# Patient Record
Sex: Female | Born: 1950 | Race: White | Hispanic: No
Health system: Southern US, Community
[De-identification: ages and names within clinical notes are randomized; demographics above are authoritative.]

## PROBLEM LIST (undated history)

## (undated) DIAGNOSIS — J9621 Acute and chronic respiratory failure with hypoxia: Secondary | ICD-10-CM

## (undated) DIAGNOSIS — J80 Acute respiratory distress syndrome: Secondary | ICD-10-CM

## (undated) DIAGNOSIS — G1221 Amyotrophic lateral sclerosis: Secondary | ICD-10-CM

## (undated) DIAGNOSIS — J690 Pneumonitis due to inhalation of food and vomit: Secondary | ICD-10-CM

---

## 2002-02-27 ENCOUNTER — Encounter: Payer: Self-pay | Admitting: Specialist

## 2002-03-05 ENCOUNTER — Inpatient Hospital Stay (HOSPITAL_COMMUNITY): Admission: RE | Admit: 2002-03-05 | Discharge: 2002-03-09 | Payer: Self-pay | Admitting: Specialist

## 2002-03-05 ENCOUNTER — Encounter: Payer: Self-pay | Admitting: Specialist

## 2002-07-10 ENCOUNTER — Ambulatory Visit (HOSPITAL_COMMUNITY): Admission: RE | Admit: 2002-07-10 | Discharge: 2002-07-10 | Payer: Self-pay | Admitting: *Deleted

## 2006-12-13 ENCOUNTER — Ambulatory Visit: Payer: Self-pay | Admitting: Family Medicine

## 2006-12-13 DIAGNOSIS — N951 Menopausal and female climacteric states: Secondary | ICD-10-CM | POA: Insufficient documentation

## 2006-12-13 DIAGNOSIS — F329 Major depressive disorder, single episode, unspecified: Secondary | ICD-10-CM

## 2006-12-13 DIAGNOSIS — I10 Essential (primary) hypertension: Secondary | ICD-10-CM | POA: Insufficient documentation

## 2006-12-13 DIAGNOSIS — R609 Edema, unspecified: Secondary | ICD-10-CM

## 2006-12-13 DIAGNOSIS — K219 Gastro-esophageal reflux disease without esophagitis: Secondary | ICD-10-CM | POA: Insufficient documentation

## 2006-12-19 ENCOUNTER — Ambulatory Visit: Payer: Self-pay | Admitting: Family Medicine

## 2006-12-20 ENCOUNTER — Telehealth (INDEPENDENT_AMBULATORY_CARE_PROVIDER_SITE_OTHER): Payer: Self-pay | Admitting: *Deleted

## 2006-12-20 ENCOUNTER — Encounter: Payer: Self-pay | Admitting: Family Medicine

## 2006-12-20 LAB — CONVERTED CEMR LAB
ALT: 16 units/L (ref 0–35)
AST: 10 units/L (ref 0–37)
Albumin: 4.2 g/dL (ref 3.5–5.2)
Alkaline Phosphatase: 96 units/L (ref 39–117)
BUN: 16 mg/dL (ref 6–23)
CO2: 24 meq/L (ref 19–32)
Calcium: 9.6 mg/dL (ref 8.4–10.5)
Chloride: 100 meq/L (ref 96–112)
Cholesterol: 198 mg/dL (ref 0–200)
Creatinine, Ser: 0.67 mg/dL (ref 0.40–1.20)
Glucose, Bld: 159 mg/dL — ABNORMAL HIGH (ref 70–99)
HDL: 47 mg/dL (ref >39)
LDL Cholesterol: 113 mg/dL — ABNORMAL HIGH (ref 0–99)
Potassium: 4.3 meq/L (ref 3.5–5.3)
Sodium: 139 meq/L (ref 135–145)
TSH: 2.191 microintl units/mL (ref 0.350–5.50)
Total Bilirubin: 0.4 mg/dL (ref 0.3–1.2)
Total CHOL/HDL Ratio: 4.2
Total Protein: 7.1 g/dL (ref 6.0–8.3)
Triglycerides: 192 mg/dL — ABNORMAL HIGH (ref <150)
VLDL: 38 mg/dL (ref 0–40)

## 2007-01-04 ENCOUNTER — Encounter: Admission: RE | Admit: 2007-01-04 | Discharge: 2007-01-04 | Payer: Self-pay | Admitting: Family Medicine

## 2007-01-08 ENCOUNTER — Encounter: Payer: Self-pay | Admitting: Family Medicine

## 2007-01-09 ENCOUNTER — Telehealth (INDEPENDENT_AMBULATORY_CARE_PROVIDER_SITE_OTHER): Payer: Self-pay | Admitting: *Deleted

## 2007-02-01 ENCOUNTER — Ambulatory Visit: Payer: Self-pay | Admitting: Family Medicine

## 2007-02-28 ENCOUNTER — Ambulatory Visit: Payer: Self-pay | Admitting: Family Medicine

## 2007-04-11 ENCOUNTER — Ambulatory Visit: Payer: Self-pay | Admitting: Family Medicine

## 2007-04-11 DIAGNOSIS — R5383 Other fatigue: Secondary | ICD-10-CM

## 2007-04-11 DIAGNOSIS — R5381 Other malaise: Secondary | ICD-10-CM

## 2007-05-03 ENCOUNTER — Telehealth: Payer: Self-pay | Admitting: Family Medicine

## 2007-05-03 ENCOUNTER — Encounter: Payer: Self-pay | Admitting: Family Medicine

## 2007-06-06 ENCOUNTER — Encounter: Payer: Self-pay | Admitting: Family Medicine

## 2007-08-02 ENCOUNTER — Telehealth: Payer: Self-pay | Admitting: Family Medicine

## 2007-10-29 ENCOUNTER — Encounter: Payer: Self-pay | Admitting: Family Medicine

## 2010-12-26 ENCOUNTER — Encounter: Payer: Self-pay | Admitting: Family Medicine

## 2014-12-10 DIAGNOSIS — M545 Low back pain: Secondary | ICD-10-CM | POA: Diagnosis not present

## 2014-12-10 DIAGNOSIS — I10 Essential (primary) hypertension: Secondary | ICD-10-CM | POA: Diagnosis not present

## 2014-12-10 DIAGNOSIS — E785 Hyperlipidemia, unspecified: Secondary | ICD-10-CM | POA: Diagnosis not present

## 2014-12-10 DIAGNOSIS — F329 Major depressive disorder, single episode, unspecified: Secondary | ICD-10-CM | POA: Diagnosis not present

## 2014-12-10 DIAGNOSIS — K219 Gastro-esophageal reflux disease without esophagitis: Secondary | ICD-10-CM | POA: Diagnosis not present

## 2014-12-10 DIAGNOSIS — E119 Type 2 diabetes mellitus without complications: Secondary | ICD-10-CM | POA: Diagnosis not present

## 2015-06-23 DIAGNOSIS — Z0000 Encounter for general adult medical examination without abnormal findings: Secondary | ICD-10-CM | POA: Diagnosis not present

## 2015-12-24 DIAGNOSIS — K210 Gastro-esophageal reflux disease with esophagitis: Secondary | ICD-10-CM | POA: Diagnosis not present

## 2015-12-24 DIAGNOSIS — Z9884 Bariatric surgery status: Secondary | ICD-10-CM | POA: Diagnosis not present

## 2015-12-24 DIAGNOSIS — I10 Essential (primary) hypertension: Secondary | ICD-10-CM | POA: Diagnosis not present

## 2015-12-24 DIAGNOSIS — R739 Hyperglycemia, unspecified: Secondary | ICD-10-CM | POA: Diagnosis not present

## 2016-02-01 DIAGNOSIS — H52223 Regular astigmatism, bilateral: Secondary | ICD-10-CM | POA: Diagnosis not present

## 2016-02-01 DIAGNOSIS — H5203 Hypermetropia, bilateral: Secondary | ICD-10-CM | POA: Diagnosis not present

## 2016-02-01 DIAGNOSIS — H524 Presbyopia: Secondary | ICD-10-CM | POA: Diagnosis not present

## 2016-02-26 DIAGNOSIS — Z1231 Encounter for screening mammogram for malignant neoplasm of breast: Secondary | ICD-10-CM | POA: Diagnosis not present

## 2017-01-10 DIAGNOSIS — R1310 Dysphagia, unspecified: Secondary | ICD-10-CM | POA: Diagnosis not present

## 2017-01-10 DIAGNOSIS — E119 Type 2 diabetes mellitus without complications: Secondary | ICD-10-CM | POA: Diagnosis not present

## 2017-01-10 DIAGNOSIS — J3801 Paralysis of vocal cords and larynx, unilateral: Secondary | ICD-10-CM | POA: Diagnosis not present

## 2017-01-10 DIAGNOSIS — Z79899 Other long term (current) drug therapy: Secondary | ICD-10-CM | POA: Diagnosis not present

## 2017-01-10 DIAGNOSIS — J384 Edema of larynx: Secondary | ICD-10-CM | POA: Diagnosis not present

## 2017-01-10 DIAGNOSIS — Z7984 Long term (current) use of oral hypoglycemic drugs: Secondary | ICD-10-CM | POA: Diagnosis not present

## 2017-01-10 DIAGNOSIS — J383 Other diseases of vocal cords: Secondary | ICD-10-CM | POA: Diagnosis not present

## 2017-01-10 DIAGNOSIS — Z885 Allergy status to narcotic agent status: Secondary | ICD-10-CM | POA: Diagnosis not present

## 2017-01-10 DIAGNOSIS — J387 Other diseases of larynx: Secondary | ICD-10-CM | POA: Diagnosis not present

## 2017-01-10 DIAGNOSIS — R05 Cough: Secondary | ICD-10-CM | POA: Diagnosis not present

## 2017-01-10 DIAGNOSIS — I10 Essential (primary) hypertension: Secondary | ICD-10-CM | POA: Diagnosis not present

## 2017-03-21 DIAGNOSIS — H5203 Hypermetropia, bilateral: Secondary | ICD-10-CM | POA: Diagnosis not present

## 2017-03-21 DIAGNOSIS — H524 Presbyopia: Secondary | ICD-10-CM | POA: Diagnosis not present

## 2017-03-21 DIAGNOSIS — H31311 Expulsive choroidal hemorrhage, right eye: Secondary | ICD-10-CM | POA: Diagnosis not present

## 2017-03-21 DIAGNOSIS — H2513 Age-related nuclear cataract, bilateral: Secondary | ICD-10-CM | POA: Diagnosis not present

## 2017-03-21 DIAGNOSIS — H52223 Regular astigmatism, bilateral: Secondary | ICD-10-CM | POA: Diagnosis not present

## 2017-03-22 DIAGNOSIS — R471 Dysarthria and anarthria: Secondary | ICD-10-CM | POA: Diagnosis not present

## 2017-03-22 DIAGNOSIS — R0602 Shortness of breath: Secondary | ICD-10-CM | POA: Diagnosis not present

## 2017-03-22 DIAGNOSIS — R1310 Dysphagia, unspecified: Secondary | ICD-10-CM | POA: Diagnosis not present

## 2017-03-22 DIAGNOSIS — J383 Other diseases of vocal cords: Secondary | ICD-10-CM | POA: Diagnosis not present

## 2017-03-22 DIAGNOSIS — R490 Dysphonia: Secondary | ICD-10-CM | POA: Diagnosis not present

## 2017-03-22 DIAGNOSIS — Z823 Family history of stroke: Secondary | ICD-10-CM | POA: Diagnosis not present

## 2017-03-22 DIAGNOSIS — J384 Edema of larynx: Secondary | ICD-10-CM | POA: Diagnosis not present

## 2017-04-03 DIAGNOSIS — I10 Essential (primary) hypertension: Secondary | ICD-10-CM | POA: Diagnosis not present

## 2017-04-03 DIAGNOSIS — M545 Low back pain: Secondary | ICD-10-CM | POA: Diagnosis not present

## 2017-04-03 DIAGNOSIS — R471 Dysarthria and anarthria: Secondary | ICD-10-CM | POA: Diagnosis not present

## 2017-04-03 DIAGNOSIS — K210 Gastro-esophageal reflux disease with esophagitis: Secondary | ICD-10-CM | POA: Diagnosis not present

## 2017-04-05 DIAGNOSIS — R1310 Dysphagia, unspecified: Secondary | ICD-10-CM | POA: Diagnosis not present

## 2017-04-05 DIAGNOSIS — R1312 Dysphagia, oropharyngeal phase: Secondary | ICD-10-CM | POA: Diagnosis not present

## 2017-04-05 DIAGNOSIS — R633 Feeding difficulties: Secondary | ICD-10-CM | POA: Diagnosis not present

## 2017-04-25 DIAGNOSIS — R471 Dysarthria and anarthria: Secondary | ICD-10-CM | POA: Diagnosis not present

## 2017-04-25 DIAGNOSIS — I10 Essential (primary) hypertension: Secondary | ICD-10-CM | POA: Diagnosis not present

## 2017-04-27 DIAGNOSIS — Z885 Allergy status to narcotic agent status: Secondary | ICD-10-CM | POA: Diagnosis not present

## 2017-04-27 DIAGNOSIS — E119 Type 2 diabetes mellitus without complications: Secondary | ICD-10-CM | POA: Diagnosis not present

## 2017-04-27 DIAGNOSIS — Z9884 Bariatric surgery status: Secondary | ICD-10-CM | POA: Diagnosis not present

## 2017-04-27 DIAGNOSIS — Z79899 Other long term (current) drug therapy: Secondary | ICD-10-CM | POA: Diagnosis not present

## 2017-04-27 DIAGNOSIS — I10 Essential (primary) hypertension: Secondary | ICD-10-CM | POA: Diagnosis not present

## 2017-04-27 DIAGNOSIS — Z7984 Long term (current) use of oral hypoglycemic drugs: Secondary | ICD-10-CM | POA: Diagnosis not present

## 2017-04-27 DIAGNOSIS — R4781 Slurred speech: Secondary | ICD-10-CM | POA: Diagnosis not present

## 2017-05-11 DIAGNOSIS — G9389 Other specified disorders of brain: Secondary | ICD-10-CM | POA: Diagnosis not present

## 2017-05-11 DIAGNOSIS — R4781 Slurred speech: Secondary | ICD-10-CM | POA: Diagnosis not present

## 2017-05-11 DIAGNOSIS — I611 Nontraumatic intracerebral hemorrhage in hemisphere, cortical: Secondary | ICD-10-CM | POA: Diagnosis not present

## 2017-05-18 DIAGNOSIS — K219 Gastro-esophageal reflux disease without esophagitis: Secondary | ICD-10-CM | POA: Diagnosis not present

## 2017-05-18 DIAGNOSIS — M5441 Lumbago with sciatica, right side: Secondary | ICD-10-CM | POA: Diagnosis not present

## 2017-05-18 DIAGNOSIS — R4781 Slurred speech: Secondary | ICD-10-CM | POA: Diagnosis not present

## 2017-05-18 DIAGNOSIS — Z79899 Other long term (current) drug therapy: Secondary | ICD-10-CM | POA: Diagnosis not present

## 2017-05-18 DIAGNOSIS — E119 Type 2 diabetes mellitus without complications: Secondary | ICD-10-CM | POA: Diagnosis not present

## 2017-05-18 DIAGNOSIS — Z8639 Personal history of other endocrine, nutritional and metabolic disease: Secondary | ICD-10-CM | POA: Diagnosis not present

## 2017-05-18 DIAGNOSIS — I10 Essential (primary) hypertension: Secondary | ICD-10-CM | POA: Diagnosis not present

## 2017-05-18 DIAGNOSIS — Z96652 Presence of left artificial knee joint: Secondary | ICD-10-CM | POA: Diagnosis not present

## 2017-05-18 DIAGNOSIS — M25569 Pain in unspecified knee: Secondary | ICD-10-CM | POA: Diagnosis not present

## 2017-05-18 DIAGNOSIS — R600 Localized edema: Secondary | ICD-10-CM | POA: Diagnosis not present

## 2017-05-18 DIAGNOSIS — Z9884 Bariatric surgery status: Secondary | ICD-10-CM | POA: Diagnosis not present

## 2017-05-18 DIAGNOSIS — G8929 Other chronic pain: Secondary | ICD-10-CM | POA: Diagnosis not present

## 2017-06-10 DIAGNOSIS — Z23 Encounter for immunization: Secondary | ICD-10-CM | POA: Diagnosis not present

## 2017-06-19 DIAGNOSIS — G939 Disorder of brain, unspecified: Secondary | ICD-10-CM | POA: Diagnosis not present

## 2017-06-19 DIAGNOSIS — R490 Dysphonia: Secondary | ICD-10-CM | POA: Diagnosis not present

## 2017-08-09 DIAGNOSIS — Z23 Encounter for immunization: Secondary | ICD-10-CM | POA: Diagnosis not present

## 2017-08-21 DIAGNOSIS — M1711 Unilateral primary osteoarthritis, right knee: Secondary | ICD-10-CM | POA: Diagnosis not present

## 2017-08-21 DIAGNOSIS — S8002XA Contusion of left knee, initial encounter: Secondary | ICD-10-CM | POA: Diagnosis not present

## 2017-08-21 DIAGNOSIS — W19XXXA Unspecified fall, initial encounter: Secondary | ICD-10-CM | POA: Diagnosis not present

## 2017-08-21 DIAGNOSIS — Z96652 Presence of left artificial knee joint: Secondary | ICD-10-CM | POA: Diagnosis not present

## 2017-08-29 DIAGNOSIS — K148 Other diseases of tongue: Secondary | ICD-10-CM | POA: Diagnosis not present

## 2017-08-29 DIAGNOSIS — K219 Gastro-esophageal reflux disease without esophagitis: Secondary | ICD-10-CM | POA: Diagnosis not present

## 2017-08-29 DIAGNOSIS — R471 Dysarthria and anarthria: Secondary | ICD-10-CM | POA: Diagnosis not present

## 2019-08-23 ENCOUNTER — Inpatient Hospital Stay
Admit: 2019-08-23 | Discharge: 2019-09-23 | Disposition: A | Discharge status: Skilled Nursing Facility | Payer: Medicare Other | Source: Ambulatory Visit | Attending: Internal Medicine | Admitting: Internal Medicine

## 2019-08-23 DIAGNOSIS — J690 Pneumonitis due to inhalation of food and vomit: Secondary | ICD-10-CM | POA: Diagnosis present

## 2019-08-23 DIAGNOSIS — J9621 Acute and chronic respiratory failure with hypoxia: Secondary | ICD-10-CM | POA: Diagnosis present

## 2019-08-23 DIAGNOSIS — G1221 Amyotrophic lateral sclerosis: Secondary | ICD-10-CM | POA: Diagnosis present

## 2019-08-23 DIAGNOSIS — J80 Acute respiratory distress syndrome: Secondary | ICD-10-CM | POA: Diagnosis present

## 2019-08-23 DIAGNOSIS — R0902 Hypoxemia: Secondary | ICD-10-CM

## 2019-08-23 DIAGNOSIS — J9690 Respiratory failure, unspecified, unspecified whether with hypoxia or hypercapnia: Secondary | ICD-10-CM

## 2019-08-23 HISTORY — DX: Pneumonitis due to inhalation of food and vomit: J69.0

## 2019-08-23 HISTORY — DX: Acute respiratory distress syndrome: J80

## 2019-08-23 HISTORY — DX: Acute and chronic respiratory failure with hypoxia: J96.21

## 2019-08-23 HISTORY — DX: Amyotrophic lateral sclerosis: G12.21

## 2019-08-23 LAB — BLOOD GAS, ARTERIAL
Acid-Base Excess: 3.9 mmol/L — ABNORMAL HIGH (ref 0.0–2.0)
Bicarbonate: 27.5 mmol/L (ref 20.0–28.0)
FIO2: 40
O2 Saturation: 96.2 %
PEEP: 5 cmH2O
Patient temperature: 98.6
Pressure control: 15 cmH2O
RATE: 10 resp/min
pCO2 arterial: 38.4 mmHg (ref 32.0–48.0)
pH, Arterial: 7.469 — ABNORMAL HIGH (ref 7.350–7.450)
pO2, Arterial: 88.3 mmHg (ref 83.0–108.0)

## 2019-08-24 LAB — COMPREHENSIVE METABOLIC PANEL
ALT: 31 U/L (ref 0–44)
AST: 21 U/L (ref 15–41)
Albumin: 1.8 g/dL — ABNORMAL LOW (ref 3.5–5.0)
Alkaline Phosphatase: 58 U/L (ref 38–126)
Anion gap: 8 (ref 5–15)
BUN: 17 mg/dL (ref 8–23)
CO2: 27 mmol/L (ref 22–32)
Calcium: 8.9 mg/dL (ref 8.9–10.3)
Chloride: 105 mmol/L (ref 98–111)
Creatinine, Ser: <0.3 mg/dL — ABNORMAL LOW (ref 0.44–1.00)
Glucose, Bld: 127 mg/dL — ABNORMAL HIGH (ref 70–99)
Potassium: 3.2 mmol/L — ABNORMAL LOW (ref 3.5–5.1)
Sodium: 140 mmol/L (ref 135–145)
Total Bilirubin: 0.6 mg/dL (ref 0.3–1.2)
Total Protein: 4.7 g/dL — ABNORMAL LOW (ref 6.5–8.1)

## 2019-08-24 LAB — CBC
HCT: 26.1 % — ABNORMAL LOW (ref 36.0–46.0)
Hemoglobin: 8.6 g/dL — ABNORMAL LOW (ref 12.0–15.0)
MCH: 30.4 pg (ref 26.0–34.0)
MCHC: 33 g/dL (ref 30.0–36.0)
MCV: 92.2 fL (ref 80.0–100.0)
Platelets: 430 10*3/uL — ABNORMAL HIGH (ref 150–400)
RBC: 2.83 MIL/uL — ABNORMAL LOW (ref 3.87–5.11)
RDW: 12.7 % (ref 11.5–15.5)
WBC: 5.6 10*3/uL (ref 4.0–10.5)
nRBC: 0 % (ref 0.0–0.2)

## 2019-08-25 ENCOUNTER — Other Ambulatory Visit (HOSPITAL_COMMUNITY): Payer: Medicare Other

## 2019-08-25 LAB — BLOOD GAS, ARTERIAL
Acid-Base Excess: 7.7 mmol/L — ABNORMAL HIGH (ref 0.0–2.0)
Bicarbonate: 33.1 mmol/L — ABNORMAL HIGH (ref 20.0–28.0)
FIO2: 60
MECHVT: 300 mL
O2 Saturation: 94.3 %
PEEP: 5 cmH2O
Patient temperature: 98
RATE: 18 resp/min
pCO2 arterial: 59.7 mmHg — ABNORMAL HIGH (ref 32.0–48.0)
pH, Arterial: 7.361 (ref 7.350–7.450)
pO2, Arterial: 80.8 mmHg — ABNORMAL LOW (ref 83.0–108.0)

## 2019-08-25 LAB — POTASSIUM: Potassium: 3.8 mmol/L (ref 3.5–5.1)

## 2019-08-26 ENCOUNTER — Encounter: Payer: Self-pay | Admitting: Internal Medicine

## 2019-08-26 DIAGNOSIS — J9621 Acute and chronic respiratory failure with hypoxia: Secondary | ICD-10-CM | POA: Diagnosis present

## 2019-08-26 DIAGNOSIS — J690 Pneumonitis due to inhalation of food and vomit: Secondary | ICD-10-CM | POA: Diagnosis not present

## 2019-08-26 DIAGNOSIS — G1221 Amyotrophic lateral sclerosis: Secondary | ICD-10-CM | POA: Diagnosis not present

## 2019-08-26 DIAGNOSIS — J80 Acute respiratory distress syndrome: Secondary | ICD-10-CM | POA: Diagnosis not present

## 2019-08-26 MED ORDER — LORAZEPAM 2 MG/ML IJ SOLN
0.50 | INTRAMUSCULAR | Status: DC
Start: ? — End: 2019-08-26

## 2019-08-26 MED ORDER — BUSPIRONE HCL 5 MG PO TABS
5.00 | ORAL_TABLET | ORAL | Status: DC
Start: 2019-08-23 — End: 2019-08-26

## 2019-08-26 MED ORDER — METOPROLOL TARTRATE 5 MG/5ML IV SOLN
5.00 | INTRAVENOUS | Status: DC
Start: ? — End: 2019-08-26

## 2019-08-26 MED ORDER — ENOXAPARIN SODIUM 40 MG/0.4ML ~~LOC~~ SOLN
40.00 | SUBCUTANEOUS | Status: DC
Start: 2019-08-24 — End: 2019-08-26

## 2019-08-26 MED ORDER — SODIUM CHLORIDE 0.9 % IV SOLN
10.00 | INTRAVENOUS | Status: DC
Start: ? — End: 2019-08-26

## 2019-08-26 MED ORDER — GENERIC EXTERNAL MEDICATION
Status: DC
Start: ? — End: 2019-08-26

## 2019-08-26 MED ORDER — SODIUM CHLORIDE (PF) 0.9 % IJ SOLN
10.00 | INTRAMUSCULAR | Status: DC
Start: ? — End: 2019-08-26

## 2019-08-26 MED ORDER — HYDROCODONE-ACETAMINOPHEN 10-325 MG PO TABS
1.00 | ORAL_TABLET | ORAL | Status: DC
Start: ? — End: 2019-08-26

## 2019-08-26 MED ORDER — IPRATROPIUM-ALBUTEROL 0.5-2.5 (3) MG/3ML IN SOLN
3.00 | RESPIRATORY_TRACT | Status: DC
Start: ? — End: 2019-08-26

## 2019-08-26 MED ORDER — ACETAMINOPHEN 325 MG PO TABS
650.00 | ORAL_TABLET | ORAL | Status: DC
Start: ? — End: 2019-08-26

## 2019-08-26 MED ORDER — NOREPINEPHRINE-SODIUM CHLORIDE 8-0.9 MG/250ML-% IV SOLN
0.10 | INTRAVENOUS | Status: DC
Start: ? — End: 2019-08-26

## 2019-08-26 MED ORDER — ONDANSETRON HCL 4 MG/2ML IJ SOLN
4.00 | INTRAMUSCULAR | Status: DC
Start: ? — End: 2019-08-26

## 2019-08-26 MED ORDER — HYDROCORTISONE NA SUCCINATE PF 100 MG IJ SOLR
50.00 | INTRAMUSCULAR | Status: DC
Start: 2019-08-23 — End: 2019-08-26

## 2019-08-26 MED ORDER — GUAIFENESIN 100 MG/5ML PO SYRP
10.00 | ORAL_SOLUTION | ORAL | Status: DC
Start: ? — End: 2019-08-26

## 2019-08-26 MED ORDER — GENERIC EXTERNAL MEDICATION
150.00 | Status: DC
Start: 2019-08-24 — End: 2019-08-26

## 2019-08-26 MED ORDER — FENTANYL CITRATE-NACL 2.5-0.9 MG/250ML-% IV SOLN
1.00 | INTRAVENOUS | Status: DC
Start: ? — End: 2019-08-26

## 2019-08-26 MED ORDER — IPRATROPIUM-ALBUTEROL 0.5-2.5 (3) MG/3ML IN SOLN
3.00 | RESPIRATORY_TRACT | Status: DC
Start: 2019-08-23 — End: 2019-08-26

## 2019-08-26 MED ORDER — GENERIC EXTERNAL MEDICATION
500.00 | Status: DC
Start: 2019-08-24 — End: 2019-08-26

## 2019-08-26 MED ORDER — SCOPOLAMINE 1 MG/3DAYS TD PT72
1.00 | MEDICATED_PATCH | TRANSDERMAL | Status: DC
Start: 2019-08-25 — End: 2019-08-26

## 2019-08-26 MED ORDER — RILUZOLE 50 MG/10ML PO SUSP
50.00 | ORAL | Status: DC
Start: 2019-08-23 — End: 2019-08-26

## 2019-08-26 MED ORDER — ALBUTEROL SULFATE (2.5 MG/3ML) 0.083% IN NEBU
2.50 | INHALATION_SOLUTION | RESPIRATORY_TRACT | Status: DC
Start: ? — End: 2019-08-26

## 2019-08-26 MED ORDER — DEXMEDETOMIDINE HCL IN NACL 400 MCG/100ML IV SOLN
0.10 | INTRAVENOUS | Status: DC
Start: ? — End: 2019-08-26

## 2019-08-26 NOTE — Consult Note (Signed)
[facility] [name]  PULMONARY SERVICE  Date of Service: 08/26/2019  PULMONARY CRITICAL CARE CONSULT   Emily Moody  VZD:[id number]  DOB: 06-15-1951   DOA: 08/23/2019  Referring Physician: [name], [name] is a 68 y.o. female seen for follow up of Acute on Chronic Respiratory Failure.  Patient has multiple medical problems including ALS depression GERD hypertension diabetes mellitus who had been already significantly compromised at home using BiPAP and was not tolerating the BiPAP.  The patient presented to the hospital with pneumonia and patient was not able to come off the BiPAP even for short time.  Patient was intubated on September 7 self extubated and failed.  Patient was reintubated at that point.  Subsequently was made attempts at weaning failed multiple times and patient basically expressed that she would like to go ahead and get a tracheostomy with the expectation that patient would remain vent dependent.  On review of her history she has basically had a progressive decline over time she has had basically limited mobility with using a walker.  And it also appears that the ALS has gotten worse significantly.  Review of Systems:  ROS performed and is unremarkable other than noted above.  Past Medical History:  Diagnosis Date  . ALS (amyotrophic lateral sclerosis) (*)  . Depression  . Diabetes mellitus (*)  . GERD (gastroesophageal reflux disease)  . Hypertension   Past Surgical History:  Procedure Laterality Date  . Cesarean section  . Hysterectomy  . Joint replacement  . Peg tube placement  . Sleeve gastroplasty   Allergies  Allergen Reactions  . Morphine Hallucinations    Medications: Reviewed on Rounds  Physical Exam:  Vitals: Temperature 97.2 pulse 109 respiratory rate 21 blood pressure 146/83 saturations 97%  Ventilator Settings mode ventilation assist control FiO2 is 40% tidal volume 400 PEEP  5  . General: Comfortable at this time . Eyes: Grossly normal lids, irises & conjunctiva . ENT: grossly tongue is normal . Neck: no obvious mass . Cardiovascular: S1-S2 normal no gallop or rub is noted . Respiratory: Coarse breath sounds noted bilaterally . Abdomen: Soft and nontender . Skin: no rash seen on limited exam . Musculoskeletal: not rigid . Psychiatric:unable to assess . Neurologic: no seizure no involuntary movements         Labs on Admission:  Basic Metabolic Panel: Recent Labs  Lab 08/24/19 0730 08/25/19 0712  NA 140  --   K 3.2* 3.8  CL 105  --   CO2 27  --   GLUCOSE 127*  --   BUN 17  --   CREATININE <0.30*  --   CALCIUM 8.9  --     Recent Labs  Lab 08/23/19 2305 08/25/19 1226  PHART 7.469* 7.361  PCO2ART 38.4 59.7*  PO2ART 88.3 80.8*  HCO3 27.5 33.1*  O2SAT 96.2 94.3    Liver Function Tests: Recent Labs  Lab 08/24/19 0730  AST 21  ALT 31  ALKPHOS 58  BILITOT 0.6  PROT 4.7*  ALBUMIN 1.8*   No results for input(s): LIPASE, AMYLASE in the last 168 hours. No results for input(s): AMMONIA in the last 168 hours.  CBC: Recent Labs  Lab 08/24/19 0730  WBC 5.6  HGB 8.6*  HCT 26.1*  MCV 92.2  PLT 430*    Cardiac Enzymes: No results for input(s): CKTOTAL, CKMB, CKMBINDEX, TROPONINI in the last 168 hours.  BNP (last 3 results) No results for input(s):  BNP in the last 8760 hours.  ProBNP (last 3 results) No results for input(s): PROBNP in the last 8760 hours.   Radiological Exams on Admission: Dg Chest Port 1 View  Result Date: 08/25/2019 CLINICAL DATA:  Hypoxia EXAM: PORTABLE CHEST 1 VIEW COMPARISON:  None. FINDINGS: There is nodular appearing airspace opacity of the left mid and lower lung, with probable small bilateral pleural effusions. Tracheostomy. The heart and mediastinum are unremarkable. Right upper extremity PICC. IMPRESSION: There is nodular appearing airspace opacity of the left mid and lower lung, with probable small  bilateral pleural effusions. Findings are concerning for infection. Tracheostomy. Electronically Signed   By: [name]  Bibbey M.D.   On: 08/25/2019 12:38    Assessment/Plan Active Problems:   Acute on chronic respiratory failure with hypoxia (HCC)   ALS (amyotrophic lateral sclerosis) (HCC)   Aspiration pneumonia due to gastric secretions (HCC)   ARDS (adult respiratory distress syndrome) (HCC)   1. Acute on chronic respiratory failure with hypoxia patient has advanced ALS and is not likely to wean off the ventilator for any prolonged periods of time.  Even at home she was having great deal of difficulty tolerating the BiPAP so therefore she is going to need support on the ventilator. 2. ALS advanced disease as indicated above she has had a decline in status over the last several months.  We will continue with vent support 3. Aspiration pneumonia this was a recent event she had gram-negative's she was treated we will continue with supportive care 4. ARDS secondary to above we will continue with supportive care overall patient's prognosis is quite guarded the last chest x-ray was showing some nodularity airspace disease.  I have personally seen and evaluated the patient, evaluated laboratory and imaging results, formulated the assessment and plan and placed orders. The Patient requires high complexity decision making for assessment and support.  Case was discussed on Rounds with the Respiratory Therapy Staff Time Spent 70minutes  Yevonne PaxSaadat A Trissa Molina, MD [facility] Pulmonary Critical Care Medicine Sleep Medicine

## 2019-08-27 ENCOUNTER — Other Ambulatory Visit (HOSPITAL_COMMUNITY): Payer: Medicare Other

## 2019-08-27 DIAGNOSIS — J80 Acute respiratory distress syndrome: Secondary | ICD-10-CM | POA: Diagnosis not present

## 2019-08-27 DIAGNOSIS — J690 Pneumonitis due to inhalation of food and vomit: Secondary | ICD-10-CM | POA: Diagnosis not present

## 2019-08-27 DIAGNOSIS — G1221 Amyotrophic lateral sclerosis: Secondary | ICD-10-CM | POA: Diagnosis not present

## 2019-08-27 DIAGNOSIS — J9621 Acute and chronic respiratory failure with hypoxia: Secondary | ICD-10-CM | POA: Diagnosis not present

## 2019-08-27 MED ORDER — GENERIC EXTERNAL MEDICATION
Status: DC
Start: ? — End: 2019-08-27

## 2019-08-27 MED ORDER — IOHEXOL 300 MG/ML  SOLN
75.0000 mL | Freq: Once | INTRAMUSCULAR | Status: AC | PRN
  Administered 2019-08-27: 75 mL via INTRAVENOUS

## 2019-08-27 NOTE — Progress Notes (Signed)
Pulmonary Critical Care Medicine [place]   PULMONARY CRITICAL CARE SERVICE  PROGRESS NOTE  Date of Service: 08/27/2019  Emily Moody  [id number]:[id number]  DOB: [birth year]   DOA: 08/23/2019  Referring Physician: [name], [name] is a 68 y.o. female seen for follow up of Acute on Chronic Respiratory Failure.  Patient is right now on pressure support mode on 40% FiO2 has been tolerating it fair  Medications: Reviewed on Rounds  Physical Exam:  Vitals: Temperature 98.0 pulse 82 respiratory rate 22 blood pressure 154/78 saturations 96%  Ventilator Settings mode ventilation pressure support FiO2 is 40% pressure support 12 PEEP 5 tidal volumes about 390  . General: Comfortable at this time . Eyes: Grossly normal lids, irises & conjunctiva . ENT: grossly tongue is normal . Neck: no obvious mass . Cardiovascular: S1 S2 normal no gallop . Respiratory: No rhonchi no rales are noted at this time . Abdomen: soft . Skin: no rash seen on limited exam . Musculoskeletal: not rigid . Psychiatric:unable to assess . Neurologic: no seizure no involuntary movements         Lab Data:   Basic Metabolic Panel: Recent Labs  Lab 08/24/19 0730 08/25/19 0712  NA 140  --   K 3.2* 3.8  CL 105  --   CO2 27  --   GLUCOSE 127*  --   BUN 17  --   CREATININE <0.30*  --   CALCIUM 8.9  --     ABG: Recent Labs  Lab 08/23/19 2305 08/25/19 1226  PHART 7.469* 7.361  PCO2ART 38.4 59.7*  PO2ART 88.3 80.8*  HCO3 27.5 33.1*  O2SAT 96.2 94.3    Liver Function Tests: Recent Labs  Lab 08/24/19 0730  AST 21  ALT 31  ALKPHOS 58  BILITOT 0.6  PROT 4.7*  ALBUMIN 1.8*   No results for input(s): LIPASE, AMYLASE in the last 168 hours. No results for input(s): AMMONIA in the last 168 hours.  CBC: Recent Labs  Lab 08/24/19 0730  WBC 5.6  HGB 8.6*  HCT 26.1*  MCV 92.2  PLT 430*    Cardiac Enzymes: No results for input(s): CKTOTAL, CKMB, CKMBINDEX,  TROPONINI in the last 168 hours.  BNP (last 3 results) No results for input(s): BNP in the last 8760 hours.  ProBNP (last 3 results) No results for input(s): PROBNP in the last 8760 hours.  Radiological Exams: Ct Chest W Contrast  Result Date: 08/27/2019 CLINICAL DATA:  Acute on chronic respiratory failure. Possible nodule on radiograph. EXAM: CT CHEST WITH CONTRAST TECHNIQUE: Multidetector CT imaging of the chest was performed during intravenous contrast administration. CONTRAST:  65mL OMNIPAQUE IOHEXOL 300 MG/ML  SOLN COMPARISON:  Chest radiographs 08/25/2019 FINDINGS: Cardiovascular: Left upper extremity PICC in the mid SVC. Mild cardiomegaly. Mitral annulus and coronary artery calcifications. Aortic atherosclerosis and tortuosity. No evidence of dissection. Contrast refluxes into the right greater than left internal jugular vein. No pericardial effusion. Mediastinum/Nodes: Tracheostomy tube in place. Fluid/secretions minimal fluid in the mid esophagus, decompressed distally. Secretions adjacent to the tracheostomy tube and in the dependent trachea. Secretions extend into the bilateral mainstem bronchus, left greater than right. Limited assessment of hilar regions due to adjacent airspace disease, no bulky mediastinal or hilar adenopathy. In the trachea above the tracheostomy tube. Lungs/Pleura: Low lung volumes with significant compressive atelectasis in both lower lobes and right middle lobe. Patchy ground-glass opacities in the anterior left upper lobe, some of which appear nodular and  may account for radiographic appearance, (series 5 image 49). No dominant pulmonary nodule. No evidence of pulmonary edema. Small bilateral pleural effusions, left greater than right. Upper Abdomen: Previous gastric surgery, partially included. No definite acute finding. Musculoskeletal: Degenerative change in the spine. There are no acute or suspicious osseous abnormalities. IMPRESSION: 1. Low lung volumes with  compressive atelectasis both lower lobes and right middle lobe. 2. Small bilateral pleural effusions, left greater than right. 3. Patchy ground-glass opacities in the anterior left upper lobe, some of which appear nodular and may account for radiographic appearance. This may be infectious or inflammatory. No solid pulmonary nodule. Consider follow-up CT. 4. Tracheostomy tube in place with associated tracheal secretions. Retained mucus in the dependent trachea and left greater than right mainstem bronchi. 5. Aortic atherosclerosis.  Coronary artery calcifications. Aortic Atherosclerosis (ICD10-I70.0). Electronically Signed   By: Narda Rutherford M.D.   On: 08/27/2019 00:55   Dg Chest Port 1 View  Result Date: 08/25/2019 CLINICAL DATA:  Hypoxia EXAM: PORTABLE CHEST 1 VIEW COMPARISON:  None. FINDINGS: There is nodular appearing airspace opacity of the left mid and lower lung, with probable small bilateral pleural effusions. Tracheostomy. The heart and mediastinum are unremarkable. Right upper extremity PICC. IMPRESSION: There is nodular appearing airspace opacity of the left mid and lower lung, with probable small bilateral pleural effusions. Findings are concerning for infection. Tracheostomy. Electronically Signed   By: Lauralyn Primes M.D.   On: 08/25/2019 12:38    Assessment/Plan Active Problems:   Acute on chronic respiratory failure with hypoxia (HCC)   ALS (amyotrophic lateral sclerosis) (HCC)   Aspiration pneumonia due to gastric secretions (HCC)   ARDS (adult respiratory distress syndrome) (HCC)   1. Acute on chronic respiratory failure with hypoxia we will continue with weaning attempts currently is on 12/5 pressure support as noted in the initial consultation she has advanced ALS not likely to come off the ventilator.  We are going to make an attempt to give her some respite off the vent however long-term goals are more or less comfort at this time 2. ALS advanced disease we will continue with  supportive care. 3. Aspiration pneumonia treated follow-up radiologically 4. ARDS clinically improving   I have personally seen and evaluated the patient, evaluated laboratory and imaging results, formulated the assessment and plan and placed orders. The Patient requires high complexity decision making for assessment and support.  Case was discussed on Rounds with the Respiratory Therapy Staff  Yevonne Pax, MD [facility] Pulmonary Critical Care Medicine Sleep Medicine

## 2019-08-28 DIAGNOSIS — J9621 Acute and chronic respiratory failure with hypoxia: Secondary | ICD-10-CM | POA: Diagnosis not present

## 2019-08-28 DIAGNOSIS — J80 Acute respiratory distress syndrome: Secondary | ICD-10-CM | POA: Diagnosis not present

## 2019-08-28 DIAGNOSIS — J690 Pneumonitis due to inhalation of food and vomit: Secondary | ICD-10-CM | POA: Diagnosis not present

## 2019-08-28 DIAGNOSIS — G1221 Amyotrophic lateral sclerosis: Secondary | ICD-10-CM | POA: Diagnosis not present

## 2019-08-28 NOTE — Progress Notes (Signed)
Pulmonary Critical Care Medicine [place]   PULMONARY CRITICAL CARE SERVICE  PROGRESS NOTE  Date of Service: 08/28/2019  [name]  VHQ:[id number]  DOB: 10-Jul-1951   DOA: 08/23/2019  Referring Physician: [name], [name] is a 68 y.o. female seen for follow up of Acute on Chronic Respiratory Failure.  Patient is on pressure support mode on 40% FiO2 at this time the patient goal is for about 4 hours  Medications: Reviewed on Rounds  Physical Exam:  Vitals: Temperature 98.6 pulse 70 respiratory 19 blood pressure 168/92 saturations 97%  Ventilator Settings mode ventilation pressure support FiO2 40% pressure poor 12 PEEP 5  . General: Comfortable at this time . Eyes: Grossly normal lids, irises & conjunctiva . ENT: grossly tongue is normal . Neck: no obvious mass . Cardiovascular: S1 S2 normal no gallop . Respiratory: No rhonchi no rales are noted at this time . Abdomen: soft . Skin: no rash seen on limited exam . Musculoskeletal: not rigid . Psychiatric:unable to assess . Neurologic: no seizure no involuntary movements         Lab Data:   Basic Metabolic Panel: Recent Labs  Lab 08/24/19 0730 08/25/19 0712  NA 140  --   K 3.2* 3.8  CL 105  --   CO2 27  --   GLUCOSE 127*  --   BUN 17  --   CREATININE <0.30*  --   CALCIUM 8.9  --     ABG: Recent Labs  Lab 08/23/19 2305 08/25/19 1226  PHART 7.469* 7.361  PCO2ART 38.4 59.7*  PO2ART 88.3 80.8*  HCO3 27.5 33.1*  O2SAT 96.2 94.3    Liver Function Tests: Recent Labs  Lab 08/24/19 0730  AST 21  ALT 31  ALKPHOS 58  BILITOT 0.6  PROT 4.7*  ALBUMIN 1.8*   No results for input(s): LIPASE, AMYLASE in the last 168 hours. No results for input(s): AMMONIA in the last 168 hours.  CBC: Recent Labs  Lab 08/24/19 0730  WBC 5.6  HGB 8.6*  HCT 26.1*  MCV 92.2  PLT 430*    Cardiac Enzymes: No results for input(s): CKTOTAL, CKMB, CKMBINDEX, TROPONINI in the last  168 hours.  BNP (last 3 results) No results for input(s): BNP in the last 8760 hours.  ProBNP (last 3 results) No results for input(s): PROBNP in the last 8760 hours.  Radiological Exams: Ct Chest W Contrast  Result Date: 08/27/2019 CLINICAL DATA:  Acute on chronic respiratory failure. Possible nodule on radiograph. EXAM: CT CHEST WITH CONTRAST TECHNIQUE: Multidetector CT imaging of the chest was performed during intravenous contrast administration. CONTRAST:  24mL OMNIPAQUE IOHEXOL 300 MG/ML  SOLN COMPARISON:  Chest radiographs 08/25/2019 FINDINGS: Cardiovascular: Left upper extremity PICC in the mid SVC. Mild cardiomegaly. Mitral annulus and coronary artery calcifications. Aortic atherosclerosis and tortuosity. No evidence of dissection. Contrast refluxes into the right greater than left internal jugular vein. No pericardial effusion. Mediastinum/Nodes: Tracheostomy tube in place. Fluid/secretions minimal fluid in the mid esophagus, decompressed distally. Secretions adjacent to the tracheostomy tube and in the dependent trachea. Secretions extend into the bilateral mainstem bronchus, left greater than right. Limited assessment of hilar regions due to adjacent airspace disease, no bulky mediastinal or hilar adenopathy. In the trachea above the tracheostomy tube. Lungs/Pleura: Low lung volumes with significant compressive atelectasis in both lower lobes and right middle lobe. Patchy ground-glass opacities in the anterior left upper lobe, some of which appear nodular and may account  for radiographic appearance, (series 5 image 49). No dominant pulmonary nodule. No evidence of pulmonary edema. Small bilateral pleural effusions, left greater than right. Upper Abdomen: Previous gastric surgery, partially included. No definite acute finding. Musculoskeletal: Degenerative change in the spine. There are no acute or suspicious osseous abnormalities. IMPRESSION: 1. Low lung volumes with compressive atelectasis  both lower lobes and right middle lobe. 2. Small bilateral pleural effusions, left greater than right. 3. Patchy ground-glass opacities in the anterior left upper lobe, some of which appear nodular and may account for radiographic appearance. This may be infectious or inflammatory. No solid pulmonary nodule. Consider follow-up CT. 4. Tracheostomy tube in place with associated tracheal secretions. Retained mucus in the dependent trachea and left greater than right mainstem bronchi. 5. Aortic atherosclerosis.  Coronary artery calcifications. Aortic Atherosclerosis (ICD10-I70.0). Electronically Signed   By: Narda Rutherford M.D.   On: 08/27/2019 00:55    Assessment/Plan Active Problems:   Acute on chronic respiratory failure with hypoxia (HCC)   ALS (amyotrophic lateral sclerosis) (HCC)   Aspiration pneumonia due to gastric secretions (HCC)   ARDS (adult respiratory distress syndrome) (HCC)   1. Acute on chronic respiratory failure with hypoxia we will continue with pressure support mode titrate oxygen continue pulmonary toilet 2. ALS advanced disease continue with supportive care patient did have a goal of 4 hours on pressure support. 3. Aspiration pneumonia treated we will continue with present management 4. ARDS slowly improving we will continue with supportive care.   I have personally seen and evaluated the patient, evaluated laboratory and imaging results, formulated the assessment and plan and placed orders. The Patient requires high complexity decision making for assessment and support.  Case was discussed on Rounds with the Respiratory Therapy Staff  Yevonne Pax, MD [facility] Pulmonary Critical Care Medicine Sleep Medicine

## 2019-08-29 DIAGNOSIS — J690 Pneumonitis due to inhalation of food and vomit: Secondary | ICD-10-CM | POA: Diagnosis not present

## 2019-08-29 DIAGNOSIS — J80 Acute respiratory distress syndrome: Secondary | ICD-10-CM | POA: Diagnosis not present

## 2019-08-29 DIAGNOSIS — J9621 Acute and chronic respiratory failure with hypoxia: Secondary | ICD-10-CM | POA: Diagnosis not present

## 2019-08-29 DIAGNOSIS — G1221 Amyotrophic lateral sclerosis: Secondary | ICD-10-CM | POA: Diagnosis not present

## 2019-08-29 NOTE — Progress Notes (Signed)
Pulmonary Critical Care Medicine [place]   PULMONARY CRITICAL CARE SERVICE  PROGRESS NOTE  Date of Service: 08/29/2019  Emily Moody  [id number]  DOB: 1951/05/08   DOA: 08/23/2019  Referring Physician: [name], [name] is a 68 y.o. female seen for follow up of Acute on Chronic Respiratory Failure.  Patient currently is on full vent support at increased agitation anxiety work of breathing noted so therefore the wean was terminated early  Medications: Reviewed on Rounds  Physical Exam:  Vitals: Temperature 97.8 pulse 60 respiratory rate 23 blood pressure 120/66 saturations 95%  Ventilator Settings mode ventilation assist control FiO2 40% tidal volume 429 PEEP 5  . General: Comfortable at this time . Eyes: Grossly normal lids, irises & conjunctiva . ENT: grossly tongue is normal . Neck: no obvious mass . Cardiovascular: S1 S2 normal no gallop . Respiratory: Scattered rhonchi expansion is equal . Abdomen: soft . Skin: no rash seen on limited exam . Musculoskeletal: not rigid . Psychiatric:unable to assess . Neurologic: no seizure no involuntary movements         Lab Data:   Basic Metabolic Panel: Recent Labs  Lab 08/24/19 0730 08/25/19 0712  NA 140  --   K 3.2* 3.8  CL 105  --   CO2 27  --   GLUCOSE 127*  --   BUN 17  --   CREATININE <0.30*  --   CALCIUM 8.9  --     ABG: Recent Labs  Lab 08/23/19 2305 08/25/19 1226  PHART 7.469* 7.361  PCO2ART 38.4 59.7*  PO2ART 88.3 80.8*  HCO3 27.5 33.1*  O2SAT 96.2 94.3    Liver Function Tests: Recent Labs  Lab 08/24/19 0730  AST 21  ALT 31  ALKPHOS 58  BILITOT 0.6  PROT 4.7*  ALBUMIN 1.8*   No results for input(s): LIPASE, AMYLASE in the last 168 hours. No results for input(s): AMMONIA in the last 168 hours.  CBC: Recent Labs  Lab 08/24/19 0730  WBC 5.6  HGB 8.6*  HCT 26.1*  MCV 92.2  PLT 430*    Cardiac Enzymes: No results for input(s): CKTOTAL,  CKMB, CKMBINDEX, TROPONINI in the last 168 hours.  BNP (last 3 results) No results for input(s): BNP in the last 8760 hours.  ProBNP (last 3 results) No results for input(s): PROBNP in the last 8760 hours.  Radiological Exams: No results found.  Assessment/Plan Active Problems:   Acute on chronic respiratory failure with hypoxia (HCC)   ALS (amyotrophic lateral sclerosis) (HCC)   Aspiration pneumonia due to gastric secretions (HCC)   ARDS (adult respiratory distress syndrome) ([place])   1. Acute on chronic respiratory failure with hypoxia we will continue with assessing the RSB I again tomorrow today hold off on weaning 2. ALS advanced disease again she is not likely going to be able to wean for any meaningful period of time the increased work of breathing may very well have been related to failure 3. Aspiration pneumonia treated continue with supportive care 4. ARDS resolving   I have personally seen and evaluated the patient, evaluated laboratory and imaging results, formulated the assessment and plan and placed orders. The Patient requires high complexity decision making for assessment and support.  Case was discussed on Rounds with the Respiratory Therapy Staff  Allyne Gee, MD [facility] Pulmonary Critical Care Medicine Sleep Medicine

## 2019-08-30 DIAGNOSIS — G1221 Amyotrophic lateral sclerosis: Secondary | ICD-10-CM | POA: Diagnosis not present

## 2019-08-30 DIAGNOSIS — J690 Pneumonitis due to inhalation of food and vomit: Secondary | ICD-10-CM | POA: Diagnosis not present

## 2019-08-30 DIAGNOSIS — J80 Acute respiratory distress syndrome: Secondary | ICD-10-CM | POA: Diagnosis not present

## 2019-08-30 DIAGNOSIS — J9621 Acute and chronic respiratory failure with hypoxia: Secondary | ICD-10-CM | POA: Diagnosis not present

## 2019-08-30 NOTE — Progress Notes (Addendum)
Pulmonary Critical Care Medicine [place]   PULMONARY CRITICAL CARE SERVICE  PROGRESS NOTE  Date of Service: 08/30/2019  Emily Moody  [id number]  DOB: [birth year]   DOA: 08/23/2019  Referring Physician: [name], [name] is a 69 y.o. female seen for follow up of Acute on Chronic Respiratory Failure.  Patient was able to 30 minutes on pressure support today and is now back on full support satting well with no distress.  Medications: Reviewed on Rounds  Physical Exam:  Vitals: Pulse 75 respirations 30 BP 1 4981 O2 sat 97% temp 97 4  Ventilator Settings ventilator mode AC VC rate of 18 tidal volume 400 PEEP 5 and FiO2 40%.  . General: Comfortable at this time . Eyes: Grossly normal lids, irises & conjunctiva . ENT: grossly tongue is normal . Neck: no obvious mass . Cardiovascular: S1 S2 normal no gallop . Respiratory: Coarse breath sounds . Abdomen: soft . Skin: no rash seen on limited exam . Musculoskeletal: not rigid . Psychiatric:unable to assess . Neurologic: no seizure no involuntary movements         Lab Data:   Basic Metabolic Panel: Recent Labs  Lab 08/24/19 0730 08/25/19 0712  NA 140  --   K 3.2* 3.8  CL 105  --   CO2 27  --   GLUCOSE 127*  --   BUN 17  --   CREATININE <0.30*  --   CALCIUM 8.9  --     ABG: Recent Labs  Lab 08/23/19 2305 08/25/19 1226  PHART 7.469* 7.361  PCO2ART 38.4 59.7*  PO2ART 88.3 80.8*  HCO3 27.5 33.1*  O2SAT 96.2 94.3    Liver Function Tests: Recent Labs  Lab 08/24/19 0730  AST 21  ALT 31  ALKPHOS 58  BILITOT 0.6  PROT 4.7*  ALBUMIN 1.8*   No results for input(s): LIPASE, AMYLASE in the last 168 hours. No results for input(s): AMMONIA in the last 168 hours.  CBC: Recent Labs  Lab 08/24/19 0730  WBC 5.6  HGB 8.6*  HCT 26.1*  MCV 92.2  PLT 430*    Cardiac Enzymes: No results for input(s): CKTOTAL, CKMB, CKMBINDEX, TROPONINI in the last 168 hours.  BNP  (last 3 results) No results for input(s): BNP in the last 8760 hours.  ProBNP (last 3 results) No results for input(s): PROBNP in the last 8760 hours.  Radiological Exams: No results found.  Assessment/Plan Active Problems:   Acute on chronic respiratory failure with hypoxia (HCC)   ALS (amyotrophic lateral sclerosis) (HCC)   Aspiration pneumonia due to gastric secretions (HCC)   ARDS (adult respiratory distress syndrome) ([place])   1. Acute on chronic respiratory failure with hypoxia patient was able to tolerate 30 minutes pressure support is now back on full support of the ventilator continue to wean as tolerated.  Suture removal will be done as trach has been inserted for 9 days. 2. ALS advanced disease again she is not likely going to be able to wean for any meaningful period of time the increased work of breathing may very well have been related to failure 3. Aspiration pneumonia treated continue with supportive care 4. ARDS resolving   I have personally seen and evaluated the patient, evaluated laboratory and imaging results, formulated the assessment and plan and placed orders. The Patient requires high complexity decision making for assessment and support.  Case was discussed on Rounds with the Respiratory Therapy Staff  Allyne Gee,  MD [facility] Pulmonary Critical Care Medicine Sleep Medicine

## 2019-08-31 DIAGNOSIS — J690 Pneumonitis due to inhalation of food and vomit: Secondary | ICD-10-CM | POA: Diagnosis not present

## 2019-08-31 DIAGNOSIS — G1221 Amyotrophic lateral sclerosis: Secondary | ICD-10-CM | POA: Diagnosis not present

## 2019-08-31 DIAGNOSIS — J80 Acute respiratory distress syndrome: Secondary | ICD-10-CM | POA: Diagnosis not present

## 2019-08-31 DIAGNOSIS — J9621 Acute and chronic respiratory failure with hypoxia: Secondary | ICD-10-CM | POA: Diagnosis not present

## 2019-08-31 NOTE — Progress Notes (Addendum)
Pulmonary Critical Care Medicine [place]   PULMONARY CRITICAL CARE SERVICE  PROGRESS NOTE  Date of Service: 08/31/2019  Emily Moody  GYI:[id number]  DOB: 1951-10-19   DOA: 08/23/2019  Referring Physician: [name], [name] is a 68 y.o. female seen for follow up of Acute on Chronic Respiratory Failure.  Patient was able to tolerate 1 hour pressure support today before her anxiety became too much to be placed back on full support on the ventilator.  Medications: Reviewed on Rounds  Physical Exam:  Vitals: Pulse 62 respirations 18 BP 170/92 O2 sat 97% temp 97 point  Ventilator Settings ventilator mode AC VC rate of 18 tidal volume 400 PEEP 5 and FiO2 40%  . General: Comfortable at this time . Eyes: Grossly normal lids, irises & conjunctiva . ENT: grossly tongue is normal . Neck: no obvious mass . Cardiovascular: S1 S2 normal no gallop . Respiratory: Coarse breath sounds . Abdomen: soft . Skin: no rash seen on limited exam . Musculoskeletal: not rigid . Psychiatric:unable to assess . Neurologic: no seizure no involuntary movements         Lab Data:   Basic Metabolic Panel: Recent Labs  Lab 08/25/19 0712  K 3.8    ABG: Recent Labs  Lab 08/25/19 1226  PHART 7.361  PCO2ART 59.7*  PO2ART 80.8*  HCO3 33.1*  O2SAT 94.3    Liver Function Tests: No results for input(s): AST, ALT, ALKPHOS, BILITOT, PROT, ALBUMIN in the last 168 hours. No results for input(s): LIPASE, AMYLASE in the last 168 hours. No results for input(s): AMMONIA in the last 168 hours.  CBC: No results for input(s): WBC, NEUTROABS, HGB, HCT, MCV, PLT in the last 168 hours.  Cardiac Enzymes: No results for input(s): CKTOTAL, CKMB, CKMBINDEX, TROPONINI in the last 168 hours.  BNP (last 3 results) No results for input(s): BNP in the last 8760 hours.  ProBNP (last 3 results) No results for input(s): PROBNP in the last 8760 hours.  Radiological Exams: No  results found.  Assessment/Plan Active Problems:   Acute on chronic respiratory failure with hypoxia (HCC)   ALS (amyotrophic lateral sclerosis) (HCC)   Aspiration pneumonia due to gastric secretions (HCC)   ARDS (adult respiratory distress syndrome) ([place])   1. Acute on chronic respiratory failure with hypoxia patient was able to tolerate pressure support for 1 hour.  Patient extremely anxious on pressure support and was placed back on full support on the ventilator.  Continue to attempt weaning.  Continue present pulmonary toilet supportive measures. 2. ALS advanced disease again she is not likely going to be able to wean for any meaningful period of time the increased work of breathing may very well have been related to failure 3. Aspiration pneumonia treated continue with supportive care 4. ARDS resolving   I have personally seen and evaluated the patient, evaluated laboratory and imaging results, formulated the assessment and plan and placed orders. The Patient requires high complexity decision making for assessment and support.  Case was discussed on Rounds with the Respiratory Therapy Staff  Allyne Gee, MD [facility] Pulmonary Critical Care Medicine Sleep Medicine

## 2019-09-01 DIAGNOSIS — J80 Acute respiratory distress syndrome: Secondary | ICD-10-CM | POA: Diagnosis not present

## 2019-09-01 DIAGNOSIS — J690 Pneumonitis due to inhalation of food and vomit: Secondary | ICD-10-CM | POA: Diagnosis not present

## 2019-09-01 DIAGNOSIS — G1221 Amyotrophic lateral sclerosis: Secondary | ICD-10-CM | POA: Diagnosis not present

## 2019-09-01 DIAGNOSIS — J9621 Acute and chronic respiratory failure with hypoxia: Secondary | ICD-10-CM | POA: Diagnosis not present

## 2019-09-01 NOTE — Progress Notes (Addendum)
Pulmonary Critical Care Medicine [place]   PULMONARY CRITICAL CARE SERVICE  PROGRESS NOTE  Date of Service: 09/01/2019  Emily Moody  FAO:[id number]  DOB: 24-Jun-1951   DOA: 08/23/2019  Referring Physician: [name], [name] is a 68 y.o. female seen for follow up of Acute on Chronic Respiratory Failure.  Patient was managed today 1.5 hours on pressure support today 12/5 with FiO2 of 40% before becoming short of breath and needing to be placed back on the ventilator.  Medications: Reviewed on Rounds  Physical Exam:  Vitals: Pulse 57 respirations 20 BP 157/86 O2 sat 96% temp 98.1  Ventilator Settings pressure support 12/5 FiO2 40%  . General: Comfortable at this time . Eyes: Grossly normal lids, irises & conjunctiva . ENT: grossly tongue is normal . Neck: no obvious mass . Cardiovascular: S1 S2 normal no gallop . Respiratory: Coarse breath sounds . Abdomen: soft . Skin: no rash seen on limited exam . Musculoskeletal: not rigid . Psychiatric:unable to assess . Neurologic: no seizure no involuntary movements         Lab Data:   Basic Metabolic Panel: No results for input(s): NA, K, CL, CO2, GLUCOSE, BUN, CREATININE, CALCIUM, MG, PHOS in the last 168 hours.  ABG: No results for input(s): PHART, PCO2ART, PO2ART, HCO3, O2SAT in the last 168 hours.  Liver Function Tests: No results for input(s): AST, ALT, ALKPHOS, BILITOT, PROT, ALBUMIN in the last 168 hours. No results for input(s): LIPASE, AMYLASE in the last 168 hours. No results for input(s): AMMONIA in the last 168 hours.  CBC: No results for input(s): WBC, NEUTROABS, HGB, HCT, MCV, PLT in the last 168 hours.  Cardiac Enzymes: No results for input(s): CKTOTAL, CKMB, CKMBINDEX, TROPONINI in the last 168 hours.  BNP (last 3 results) No results for input(s): BNP in the last 8760 hours.  ProBNP (last 3 results) No results for input(s): PROBNP in the last 8760  hours.  Radiological Exams: No results found.  Assessment/Plan Active Problems:   Acute on chronic respiratory failure with hypoxia (HCC)   ALS (amyotrophic lateral sclerosis) (HCC)   Aspiration pneumonia due to gastric secretions (HCC)   ARDS (adult respiratory distress syndrome) ([place])   1. Acute on chronic respiratory failure with hypoxiapatient was able to tolerate pressure support for 1.5  hours.  Patient extremely anxious on pressure support and was placed back on full support on the ventilator.  Continue to attempt weaning.  Continue present pulmonary toilet supportive measures. 2. ALS advanced disease again she is not likely going to be able to wean for any meaningful period of time the increased work of breathing may very well have been related to failure 3. Aspiration pneumonia treated continue with supportive care 4. ARDS resolving   I have personally seen and evaluated the patient, evaluated laboratory and imaging results, formulated the assessment and plan and placed orders. The Patient requires high complexity decision making for assessment and support.  Case was discussed on Rounds with the Respiratory Therapy Staff  Allyne Gee, MD [facility] Pulmonary Critical Care Medicine Sleep Medicine

## 2019-09-02 ENCOUNTER — Other Ambulatory Visit (HOSPITAL_COMMUNITY): Payer: Medicare Other

## 2019-09-02 DIAGNOSIS — J80 Acute respiratory distress syndrome: Secondary | ICD-10-CM | POA: Diagnosis not present

## 2019-09-02 DIAGNOSIS — J9621 Acute and chronic respiratory failure with hypoxia: Secondary | ICD-10-CM | POA: Diagnosis not present

## 2019-09-02 DIAGNOSIS — G1221 Amyotrophic lateral sclerosis: Secondary | ICD-10-CM | POA: Diagnosis not present

## 2019-09-02 DIAGNOSIS — J690 Pneumonitis due to inhalation of food and vomit: Secondary | ICD-10-CM | POA: Diagnosis not present

## 2019-09-02 LAB — BASIC METABOLIC PANEL
Anion gap: 10 (ref 5–15)
BUN: 22 mg/dL (ref 8–23)
CO2: 33 mmol/L — ABNORMAL HIGH (ref 22–32)
Calcium: 9 mg/dL (ref 8.9–10.3)
Chloride: 103 mmol/L (ref 98–111)
Creatinine, Ser: 0.34 mg/dL — ABNORMAL LOW (ref 0.44–1.00)
GFR calc Af Amer: >60 mL/min (ref >60)
GFR calc non Af Amer: >60 mL/min (ref >60)
Glucose, Bld: 174 mg/dL — ABNORMAL HIGH (ref 70–99)
Potassium: 2.8 mmol/L — ABNORMAL LOW (ref 3.5–5.1)
Sodium: 146 mmol/L — ABNORMAL HIGH (ref 135–145)

## 2019-09-02 LAB — CBC
HCT: 33.5 % — ABNORMAL LOW (ref 36.0–46.0)
Hemoglobin: 10.8 g/dL — ABNORMAL LOW (ref 12.0–15.0)
MCH: 30.5 pg (ref 26.0–34.0)
MCHC: 32.2 g/dL (ref 30.0–36.0)
MCV: 94.6 fL (ref 80.0–100.0)
Platelets: 280 10*3/uL (ref 150–400)
RBC: 3.54 MIL/uL — ABNORMAL LOW (ref 3.87–5.11)
RDW: 12.9 % (ref 11.5–15.5)
WBC: 6.3 10*3/uL (ref 4.0–10.5)
nRBC: 0 % (ref 0.0–0.2)

## 2019-09-02 MED ORDER — GENERIC EXTERNAL MEDICATION
Status: DC
Start: ? — End: 2019-09-02

## 2019-09-02 NOTE — Progress Notes (Addendum)
Pulmonary Critical Care Medicine [facility] [place]   PULMONARY CRITICAL CARE SERVICE  PROGRESS NOTE  Date of Service: 09/02/2019  Emily Moody  [id number]:[id number]  DOB: [birth year]   DOA: 08/23/2019  Referring Physician: Carron Curie, [name] is a 68 y.o. female seen for follow up of Acute on Chronic Respiratory Failure.  Patient continues on pressure support 12/5 FiO2 30% for goal of 2 to 4 hours today, satting well no fever or distress.  Medications: Reviewed on Rounds  Physical Exam:  Vitals: Pulse 60 respiration 24 BP 150/85 O2 sat 97% 7.7  Ventilator Settings pressure support 12/5 FiO2 30%  . General: Comfortable at this time . Eyes: Grossly normal lids, irises & conjunctiva . ENT: grossly tongue is normal . Neck: no obvious mass . Cardiovascular: S1 S2 normal no gallop . Respiratory: Coarse breath sounds . Abdomen: soft . Skin: no rash seen on limited exam . Musculoskeletal: not rigid . Psychiatric:unable to assess . Neurologic: no seizure no involuntary movements         Lab Data:   Basic Metabolic Panel: Recent Labs  Lab 09/02/19 1023  NA 146*  K 2.8*  CL 103  CO2 33*  GLUCOSE 174*  BUN 22  CREATININE 0.34*  CALCIUM 9.0    ABG: No results for input(s): PHART, PCO2ART, PO2ART, HCO3, O2SAT in the last 168 hours.  Liver Function Tests: No results for input(s): AST, ALT, ALKPHOS, BILITOT, PROT, ALBUMIN in the last 168 hours. No results for input(s): LIPASE, AMYLASE in the last 168 hours. No results for input(s): AMMONIA in the last 168 hours.  CBC: Recent Labs  Lab 09/02/19 1023  WBC 6.3  HGB 10.8*  HCT 33.5*  MCV 94.6  PLT 280    Cardiac Enzymes: No results for input(s): CKTOTAL, CKMB, CKMBINDEX, TROPONINI in the last 168 hours.  BNP (last 3 results) No results for input(s): BNP in the last 8760 hours.  ProBNP (last 3 results) No results for input(s): PROBNP in the last 8760 hours.  Radiological Exams: Dg Chest  Port 1 View  Result Date: 09/02/2019 CLINICAL DATA:  Tracheostomy.  Respiratory failure. EXAM: PORTABLE CHEST 1 VIEW COMPARISON:  CT 08/27/2019.  Chest x-ray 08/25/2019. FINDINGS: Interim removal of right PICC line. Tracheostomy tube in stable position. Gastrostomy tube in stable position. Heart size stable. Interim improvement of nodular infiltrates in the left lung. Persistent bibasilar atelectasis. Tiny left pleural effusion cannot be excluded. No pneumothorax. Calcific changes in the left upper quadrant unchanged. Degenerative changes scoliosis thoracic spine. IMPRESSION: 1. Interim removal of right PICC line. Tracheostomy tube in stable position. Gastrostomy tube in stable position. 2. Interim improvement of nodular infiltrates in the left lung. Persistent bibasilar atelectasis. Tiny left pleural effusion cannot be excluded. Electronically Signed   By: Maisie Fus  Register   On: 09/02/2019 06:33    Assessment/Plan Active Problems:   Acute on chronic respiratory failure with hypoxia (HCC)   ALS (amyotrophic lateral sclerosis) (HCC)   Aspiration pneumonia due to gastric secretions (HCC)   ARDS (adult respiratory distress syndrome) (HCC)   1. Acute on chronic respiratory failure with hypoxiapatient will complete to 4 hours today on pressure support in the back of both ventilator currently satting well continues to measure pulmonary toilet this time. 2. ALS advanced disease again she is not likely going to be able to wean for any meaningful period of time the increased work of breathing may very well have been related to failure 3. Aspiration pneumonia  treated continue with supportive care 4. ARDS resolving   I have personally seen and evaluated the patient, evaluated laboratory and imaging results, formulated the assessment and plan and placed orders. The Patient requires high complexity decision making for assessment and support.  Case was discussed on Rounds with the Respiratory Therapy  Staff  Allyne Gee, MD [facility] Pulmonary Critical Care Medicine Sleep Medicine

## 2019-09-03 DIAGNOSIS — J9621 Acute and chronic respiratory failure with hypoxia: Secondary | ICD-10-CM | POA: Diagnosis not present

## 2019-09-03 DIAGNOSIS — G1221 Amyotrophic lateral sclerosis: Secondary | ICD-10-CM | POA: Diagnosis not present

## 2019-09-03 DIAGNOSIS — J690 Pneumonitis due to inhalation of food and vomit: Secondary | ICD-10-CM | POA: Diagnosis not present

## 2019-09-03 DIAGNOSIS — J80 Acute respiratory distress syndrome: Secondary | ICD-10-CM | POA: Diagnosis not present

## 2019-09-03 LAB — POTASSIUM: Potassium: 4 mmol/L (ref 3.5–5.1)

## 2019-09-03 NOTE — Progress Notes (Signed)
Pulmonary Critical Care Medicine [facility] [place]   PULMONARY CRITICAL CARE SERVICE  PROGRESS NOTE  Date of Service: 09/03/2019  Emily Moody  KGO:[id number]  DOB: 10-31-1951   DOA: 08/23/2019  Referring Physician: Carron Curie, [name] is a 68 y.o. female seen for follow up of Acute on Chronic Respiratory Failure.  Patient currently is on pressure support mode with a goal of about 12 hours today  Medications: Reviewed on Rounds  Physical Exam:  Vitals: Temperature 98.3 pulse 63 respiratory rate 22 blood pressure 163/91 saturations 99%  Ventilator Settings mode ventilation pressure support FiO2 28% tidal volume 300 PEEP 5 pressure support 12  . General: Comfortable at this time . Eyes: Grossly normal lids, irises & conjunctiva . ENT: grossly tongue is normal . Neck: no obvious mass . Cardiovascular: S1 S2 normal no gallop . Respiratory: No rhonchi no rales are noted at this time . Abdomen: soft . Skin: no rash seen on limited exam . Musculoskeletal: not rigid . Psychiatric:unable to assess . Neurologic: no seizure no involuntary movements         Lab Data:   Basic Metabolic Panel: Recent Labs  Lab 09/02/19 1023 09/03/19 0628  NA 146*  --   K 2.8* 4.0  CL 103  --   CO2 33*  --   GLUCOSE 174*  --   BUN 22  --   CREATININE 0.34*  --   CALCIUM 9.0  --     ABG: No results for input(s): PHART, PCO2ART, PO2ART, HCO3, O2SAT in the last 168 hours.  Liver Function Tests: No results for input(s): AST, ALT, ALKPHOS, BILITOT, PROT, ALBUMIN in the last 168 hours. No results for input(s): LIPASE, AMYLASE in the last 168 hours. No results for input(s): AMMONIA in the last 168 hours.  CBC: Recent Labs  Lab 09/02/19 1023  WBC 6.3  HGB 10.8*  HCT 33.5*  MCV 94.6  PLT 280    Cardiac Enzymes: No results for input(s): CKTOTAL, CKMB, CKMBINDEX, TROPONINI in the last 168 hours.  BNP (last 3 results) No results for input(s): BNP in the last  8760 hours.  ProBNP (last 3 results) No results for input(s): PROBNP in the last 8760 hours.  Radiological Exams: Dg Chest Port 1 View  Result Date: 09/02/2019 CLINICAL DATA:  Tracheostomy.  Respiratory failure. EXAM: PORTABLE CHEST 1 VIEW COMPARISON:  CT 08/27/2019.  Chest x-ray 08/25/2019. FINDINGS: Interim removal of right PICC line. Tracheostomy tube in stable position. Gastrostomy tube in stable position. Heart size stable. Interim improvement of nodular infiltrates in the left lung. Persistent bibasilar atelectasis. Tiny left pleural effusion cannot be excluded. No pneumothorax. Calcific changes in the left upper quadrant unchanged. Degenerative changes scoliosis thoracic spine. IMPRESSION: 1. Interim removal of right PICC line. Tracheostomy tube in stable position. Gastrostomy tube in stable position. 2. Interim improvement of nodular infiltrates in the left lung. Persistent bibasilar atelectasis. Tiny left pleural effusion cannot be excluded. Electronically Signed   By: Maisie Fus  Register   On: 09/02/2019 06:33    Assessment/Plan Active Problems:   Acute on chronic respiratory failure with hypoxia (HCC)   ALS (amyotrophic lateral sclerosis) (HCC)   Aspiration pneumonia due to gastric secretions (HCC)   ARDS (adult respiratory distress syndrome) (HCC)   1. Acute on chronic respiratory failure with hypoxia patient continues on pressure support mode currently on 28% FiO2 with a good PEEP we will continue with present management 2. ALS advanced disease patient is not able  to wean completely off the ventilator secondary to advanced disease 3. Aspiration pneumonia treated we will monitor 4. ARDS slowly improving we will continue with supportive care   I have personally seen and evaluated the patient, evaluated laboratory and imaging results, formulated the assessment and plan and placed orders. The Patient requires high complexity decision making for assessment and support.  Case was  discussed on Rounds with the Respiratory Therapy Staff  Allyne Gee, MD [facility] Pulmonary Critical Care Medicine Sleep Medicine

## 2019-09-04 DIAGNOSIS — J9621 Acute and chronic respiratory failure with hypoxia: Secondary | ICD-10-CM | POA: Diagnosis not present

## 2019-09-04 DIAGNOSIS — G1221 Amyotrophic lateral sclerosis: Secondary | ICD-10-CM | POA: Diagnosis not present

## 2019-09-04 DIAGNOSIS — J690 Pneumonitis due to inhalation of food and vomit: Secondary | ICD-10-CM | POA: Diagnosis not present

## 2019-09-04 DIAGNOSIS — J80 Acute respiratory distress syndrome: Secondary | ICD-10-CM | POA: Diagnosis not present

## 2019-09-04 NOTE — Progress Notes (Signed)
Pulmonary Critical Care Medicine [place]   PULMONARY CRITICAL CARE SERVICE  PROGRESS NOTE  Date of Service: 09/04/2019  Emily Moody  YBW:[id number]  DOB: June 08, 1951   DOA: 08/23/2019  Referring Physician: [name], MD  HPI: PERCY COMP is a 68 y.o. female seen for follow up of Acute on Chronic Respiratory Failure.  Patient currently is on pressure support mode has been on 28% FiO2 with good saturations.  The goal is for 16 hours  Medications: Reviewed on Rounds  Physical Exam:  Vitals: Temperature 96.8 pulse 78 respiratory rate 20 blood pressure 119/64 saturations 97%  Ventilator Settings mode ventilation pressure support FiO2 28% pressure 12 PEEP 5 tidal volume is 350 cc  . General: Comfortable at this time . Eyes: Grossly normal lids, irises & conjunctiva . ENT: grossly tongue is normal . Neck: no obvious mass . Cardiovascular: S1 S2 normal no gallop . Respiratory: No rhonchi no rales are noted at this time . Abdomen: soft . Skin: no rash seen on limited exam . Musculoskeletal: not rigid . Psychiatric:unable to assess . Neurologic: no seizure no involuntary movements         Lab Data:   Basic Metabolic Panel: Recent Labs  Lab 09/02/19 1023 09/03/19 0628  NA 146*  --   K 2.8* 4.0  CL 103  --   CO2 33*  --   GLUCOSE 174*  --   BUN 22  --   CREATININE 0.34*  --   CALCIUM 9.0  --     ABG: No results for input(s): PHART, PCO2ART, PO2ART, HCO3, O2SAT in the last 168 hours.  Liver Function Tests: No results for input(s): AST, ALT, ALKPHOS, BILITOT, PROT, ALBUMIN in the last 168 hours. No results for input(s): LIPASE, AMYLASE in the last 168 hours. No results for input(s): AMMONIA in the last 168 hours.  CBC: Recent Labs  Lab 09/02/19 1023  WBC 6.3  HGB 10.8*  HCT 33.5*  MCV 94.6  PLT 280    Cardiac Enzymes: No results for input(s): CKTOTAL, CKMB, CKMBINDEX, TROPONINI in the last 168 hours.  BNP (last 3 results) No  results for input(s): BNP in the last 8760 hours.  ProBNP (last 3 results) No results for input(s): PROBNP in the last 8760 hours.  Radiological Exams: No results found.  Assessment/Plan Active Problems:   Acute on chronic respiratory failure with hypoxia (HCC)   ALS (amyotrophic lateral sclerosis) (HCC)   Aspiration pneumonia due to gastric secretions (HCC)   ARDS (adult respiratory distress syndrome) ([place])   1. Acute on chronic respiratory failure hypoxia patient is continued for about 16 hours on PSV patient has failed prior to this.  Continue to monitor closely. 2. ALS advanced disease we will continue with supportive care 3. ARDS slow to improve 4. Aspiration pneumonia treated we will continue with supportive care   I have personally seen and evaluated the patient, evaluated laboratory and imaging results, formulated the assessment and plan and placed orders. The Patient requires high complexity decision making for assessment and support.  Case was discussed on Rounds with the Respiratory Therapy Staff  Allyne Gee, MD [facility] Pulmonary Critical Care Medicine Sleep Medicine

## 2019-09-05 DIAGNOSIS — J80 Acute respiratory distress syndrome: Secondary | ICD-10-CM | POA: Diagnosis not present

## 2019-09-05 DIAGNOSIS — J9621 Acute and chronic respiratory failure with hypoxia: Secondary | ICD-10-CM | POA: Diagnosis not present

## 2019-09-05 DIAGNOSIS — G1221 Amyotrophic lateral sclerosis: Secondary | ICD-10-CM | POA: Diagnosis not present

## 2019-09-05 DIAGNOSIS — J690 Pneumonitis due to inhalation of food and vomit: Secondary | ICD-10-CM | POA: Diagnosis not present

## 2019-09-05 NOTE — Progress Notes (Addendum)
Pulmonary Critical Care Medicine [place]   PULMONARY CRITICAL CARE SERVICE  PROGRESS NOTE  Date of Service: 09/05/2019  Emily Moody  EHU:[id number]  DOB: 23-Sep-1951   DOA: 08/23/2019  Referring Physician: [name], [name] is a 68 y.o. female seen for follow up of Acute on Chronic Respiratory Failure.  Patient has a 2-hour on aerosol trach collar however was unable to complete it today.  She is back on pressure support 12/5 FiO2 28%.  Medications: Reviewed on Rounds  Physical Exam:  Vitals: Pulse 63 respiration 27 BP 125/72 O2 sat 98% temp 98.6  Ventilator Settings pressure support 12/5 FiO2 28%.  . General: Comfortable at this time . Eyes: Grossly normal lids, irises & conjunctiva . ENT: grossly tongue is normal . Neck: no obvious mass . Cardiovascular: S1 S2 normal no gallop . Respiratory: No rales or rhonchi noted . Abdomen: soft . Skin: no rash seen on limited exam . Musculoskeletal: not rigid . Psychiatric:unable to assess . Neurologic: no seizure no involuntary movements         Lab Data:   Basic Metabolic Panel: Recent Labs  Lab 09/02/19 1023 09/03/19 0628  NA 146*  --   K 2.8* 4.0  CL 103  --   CO2 33*  --   GLUCOSE 174*  --   BUN 22  --   CREATININE 0.34*  --   CALCIUM 9.0  --     ABG: No results for input(s): PHART, PCO2ART, PO2ART, HCO3, O2SAT in the last 168 hours.  Liver Function Tests: No results for input(s): AST, ALT, ALKPHOS, BILITOT, PROT, ALBUMIN in the last 168 hours. No results for input(s): LIPASE, AMYLASE in the last 168 hours. No results for input(s): AMMONIA in the last 168 hours.  CBC: Recent Labs  Lab 09/02/19 1023  WBC 6.3  HGB 10.8*  HCT 33.5*  MCV 94.6  PLT 280    Cardiac Enzymes: No results for input(s): CKTOTAL, CKMB, CKMBINDEX, TROPONINI in the last 168 hours.  BNP (last 3 results) No results for input(s): BNP in the last 8760 hours.  ProBNP (last 3 results) No  results for input(s): PROBNP in the last 8760 hours.  Radiological Exams: No results found.  Assessment/Plan Active Problems:   Acute on chronic respiratory failure with hypoxia (HCC)   ALS (amyotrophic lateral sclerosis) (HCC)   Aspiration pneumonia due to gastric secretions (HCC)   ARDS (adult respiratory distress syndrome) ([place])   1. Acute on chronic respiratory failure hypoxia patient unable to wean today we will continue with pressure support 12/5 FiO2 28% time continue supportive measures pulmonary toilet. 2. ALS advanced disease we will continue with supportive care 3. ARDS slow to improve 4. Aspiration pneumonia treated we will continue with supportive care   I have personally seen and evaluated the patient, evaluated laboratory and imaging results, formulated the assessment and plan and placed orders. The Patient requires high complexity decision making for assessment and support.  Case was discussed on Rounds with the Respiratory Therapy Staff  Allyne Gee, MD [facility] Pulmonary Critical Care Medicine Sleep Medicine

## 2019-09-06 DIAGNOSIS — J9621 Acute and chronic respiratory failure with hypoxia: Secondary | ICD-10-CM | POA: Diagnosis not present

## 2019-09-06 DIAGNOSIS — J80 Acute respiratory distress syndrome: Secondary | ICD-10-CM | POA: Diagnosis not present

## 2019-09-06 DIAGNOSIS — J690 Pneumonitis due to inhalation of food and vomit: Secondary | ICD-10-CM | POA: Diagnosis not present

## 2019-09-06 DIAGNOSIS — G1221 Amyotrophic lateral sclerosis: Secondary | ICD-10-CM | POA: Diagnosis not present

## 2019-09-06 NOTE — Progress Notes (Addendum)
Pulmonary Critical Care Medicine [place]   PULMONARY CRITICAL CARE SERVICE  PROGRESS NOTE  Date of Service: 09/06/2019  Emily Moody  [id number]  DOB: 1951/03/25   DOA: 08/23/2019  Referring Physician: [name], [name] is a 68 y.o. female seen for follow up of Acute on Chronic Respiratory Failure.  Patient is 16 hours on pressure support 12/5 FiO2 28% yesterday goal today is to wean to 2 hours on aerosol trach collar 28%.  Patient is mildly yet however respiratory is planning on shortly.  Medications: Reviewed on Rounds  Physical Exam:  Vitals: Pulse 63 respirations 12 BP 152/91 O2 sat 98% temp 98.5  Ventilator Settings pressure support 12/5 FiO2 20%  . General: Comfortable at this time . Eyes: Grossly normal lids, irises & conjunctiva . ENT: grossly tongue is normal . Neck: no obvious mass . Cardiovascular: S1 S2 normal no gallop . Respiratory: No rales or rhonchi noted . Abdomen: soft . Skin: no rash seen on limited exam . Musculoskeletal: not rigid . Psychiatric:unable to assess . Neurologic: no seizure no involuntary movements         Lab Data:   Basic Metabolic Panel: Recent Labs  Lab 09/02/19 1023 09/03/19 0628  NA 146*  --   K 2.8* 4.0  CL 103  --   CO2 33*  --   GLUCOSE 174*  --   BUN 22  --   CREATININE 0.34*  --   CALCIUM 9.0  --     ABG: No results for input(s): PHART, PCO2ART, PO2ART, HCO3, O2SAT in the last 168 hours.  Liver Function Tests: No results for input(s): AST, ALT, ALKPHOS, BILITOT, PROT, ALBUMIN in the last 168 hours. No results for input(s): LIPASE, AMYLASE in the last 168 hours. No results for input(s): AMMONIA in the last 168 hours.  CBC: Recent Labs  Lab 09/02/19 1023  WBC 6.3  HGB 10.8*  HCT 33.5*  MCV 94.6  PLT 280    Cardiac Enzymes: No results for input(s): CKTOTAL, CKMB, CKMBINDEX, TROPONINI in the last 168 hours.  BNP (last 3 results) No results for input(s): BNP  in the last 8760 hours.  ProBNP (last 3 results) No results for input(s): PROBNP in the last 8760 hours.  Radiological Exams: No results found.  Assessment/Plan Active Problems:   Acute on chronic respiratory failure with hypoxia (HCC)   ALS (amyotrophic lateral sclerosis) (HCC)   Aspiration pneumonia due to gastric secretions (HCC)   ARDS (adult respiratory distress syndrome) ([place])   1. Acute on chronic respiratory failure hypoxia  patient is doing well with weaning currently on pressure support will wean to aerosol trach collar today for 2 hours as scheduled.  Continue supportive measures pulmonary toilet. 2. ALS advanced disease we will continue with supportive care 3. ARDS slow to improve 4. Aspiration pneumonia treated we will continue with supportive care   I have personally seen and evaluated the patient, evaluated laboratory and imaging results, formulated the assessment and plan and placed orders. The Patient requires high complexity decision making for assessment and support.  Case was discussed on Rounds with the Respiratory Therapy Staff  Allyne Gee, MD [facility] Pulmonary Critical Care Medicine Sleep Medicine

## 2019-09-07 DIAGNOSIS — G1221 Amyotrophic lateral sclerosis: Secondary | ICD-10-CM | POA: Diagnosis not present

## 2019-09-07 DIAGNOSIS — J9621 Acute and chronic respiratory failure with hypoxia: Secondary | ICD-10-CM | POA: Diagnosis not present

## 2019-09-07 DIAGNOSIS — J690 Pneumonitis due to inhalation of food and vomit: Secondary | ICD-10-CM | POA: Diagnosis not present

## 2019-09-07 DIAGNOSIS — J80 Acute respiratory distress syndrome: Secondary | ICD-10-CM | POA: Diagnosis not present

## 2019-09-07 NOTE — Progress Notes (Addendum)
Pulmonary Critical Care Medicine [place]   PULMONARY CRITICAL CARE SERVICE  PROGRESS NOTE  Date of Service: 09/07/2019  Emily Moody  [id number]:[id number]  DOB: 16-Feb-1951   DOA: 08/23/2019  Referring Physician: [name], [name] is a 68 y.o. female seen for follow up of Acute on Chronic Respiratory Failure.  Patient mains on pressure support over 5 FiO2 20% for goal of 16 hours today.  Satting well with no distress.  Medications: Reviewed on Rounds  Physical Exam:  Vitals: Pulse 59 respirations 19 BP 120/68 O2 sat 96% temp 97.6  Ventilator Settings pressure support 12/5 FiO2 28%  . General: Comfortable at this time . Eyes: Grossly normal lids, irises & conjunctiva . ENT: grossly tongue is normal . Neck: no obvious mass . Cardiovascular: S1 S2 normal no gallop . Respiratory: No rales or rhonchi noted . Abdomen: soft . Skin: no rash seen on limited exam . Musculoskeletal: not rigid . Psychiatric:unable to assess . Neurologic: no seizure no involuntary movements         Lab Data:   Basic Metabolic Panel: Recent Labs  Lab 09/02/19 1023 09/03/19 0628  NA 146*  --   K 2.8* 4.0  CL 103  --   CO2 33*  --   GLUCOSE 174*  --   BUN 22  --   CREATININE 0.34*  --   CALCIUM 9.0  --     ABG: No results for input(s): PHART, PCO2ART, PO2ART, HCO3, O2SAT in the last 168 hours.  Liver Function Tests: No results for input(s): AST, ALT, ALKPHOS, BILITOT, PROT, ALBUMIN in the last 168 hours. No results for input(s): LIPASE, AMYLASE in the last 168 hours. No results for input(s): AMMONIA in the last 168 hours.  CBC: Recent Labs  Lab 09/02/19 1023  WBC 6.3  HGB 10.8*  HCT 33.5*  MCV 94.6  PLT 280    Cardiac Enzymes: No results for input(s): CKTOTAL, CKMB, CKMBINDEX, TROPONINI in the last 168 hours.  BNP (last 3 results) No results for input(s): BNP in the last 8760 hours.  ProBNP (last 3 results) No results for input(s): PROBNP  in the last 8760 hours.  Radiological Exams: No results found.  Assessment/Plan Active Problems:   Acute on chronic respiratory failure with hypoxia (HCC)   ALS (amyotrophic lateral sclerosis) (HCC)   Aspiration pneumonia due to gastric secretions (HCC)   ARDS (adult respiratory distress syndrome) ([place])   1. Acute on chronic respiratory failure hypoxia  patient has a goal of 16 hours on pressure support today we will continue weaning per protocol.  Continue supportive measures and pulmonary toilet. 2. ALS advanced disease we will continue with supportive care 3. ARDS slow to improve 4. Aspiration pneumonia treated we will continue with supportive care   I have personally seen and evaluated the patient, evaluated laboratory and imaging results, formulated the assessment and plan and placed orders. The Patient requires high complexity decision making for assessment and support.  Case was discussed on Rounds with the Respiratory Therapy Staff  Allyne Gee, MD [facility] Pulmonary Critical Care Medicine Sleep Medicine

## 2019-09-08 DIAGNOSIS — G1221 Amyotrophic lateral sclerosis: Secondary | ICD-10-CM | POA: Diagnosis not present

## 2019-09-08 DIAGNOSIS — J80 Acute respiratory distress syndrome: Secondary | ICD-10-CM | POA: Diagnosis not present

## 2019-09-08 DIAGNOSIS — J9621 Acute and chronic respiratory failure with hypoxia: Secondary | ICD-10-CM | POA: Diagnosis not present

## 2019-09-08 DIAGNOSIS — J690 Pneumonitis due to inhalation of food and vomit: Secondary | ICD-10-CM | POA: Diagnosis not present

## 2019-09-08 NOTE — Progress Notes (Addendum)
Pulmonary Critical Care Medicine [place]   PULMONARY CRITICAL CARE SERVICE  PROGRESS NOTE  Date of Service: 09/08/2019  Emily Moody  SVX:[id number]  DOB: 02/05/1951   DOA: 08/23/2019  Referring Physician: [name], [name] is a 68 y.o. female seen for follow up of Acute on Chronic Respiratory Failure.  Patient 16-hour goal today on pressure support over 5 FiO2 28% satting well at this time we will continue supportive measures.  Medications: Reviewed on Rounds  Physical Exam:  Vitals: Pulse 60 respirations 20 BP 129/76 O2 78% temp 97.8  Ventilator Settings per support 12/5 FiO2 28%  . General: Comfortable at this time . Eyes: Grossly normal lids, irises & conjunctiva . ENT: grossly tongue is normal . Neck: no obvious mass . Cardiovascular: S1 S2 normal no gallop . Respiratory: No rales or rhonchi noted . Abdomen: soft . Skin: no rash seen on limited exam . Musculoskeletal: not rigid . Psychiatric:unable to assess . Neurologic: no seizure no involuntary movements         Lab Data:   Basic Metabolic Panel: Recent Labs  Lab 09/02/19 1023 09/03/19 0628  NA 146*  --   K 2.8* 4.0  CL 103  --   CO2 33*  --   GLUCOSE 174*  --   BUN 22  --   CREATININE 0.34*  --   CALCIUM 9.0  --     ABG: No results for input(s): PHART, PCO2ART, PO2ART, HCO3, O2SAT in the last 168 hours.  Liver Function Tests: No results for input(s): AST, ALT, ALKPHOS, BILITOT, PROT, ALBUMIN in the last 168 hours. No results for input(s): LIPASE, AMYLASE in the last 168 hours. No results for input(s): AMMONIA in the last 168 hours.  CBC: Recent Labs  Lab 09/02/19 1023  WBC 6.3  HGB 10.8*  HCT 33.5*  MCV 94.6  PLT 280    Cardiac Enzymes: No results for input(s): CKTOTAL, CKMB, CKMBINDEX, TROPONINI in the last 168 hours.  BNP (last 3 results) No results for input(s): BNP in the last 8760 hours.  ProBNP (last 3 results) No results for input(s):  PROBNP in the last 8760 hours.  Radiological Exams: No results found.  Assessment/Plan Active Problems:   Acute on chronic respiratory failure with hypoxia (HCC)   ALS (amyotrophic lateral sclerosis) (HCC)   Aspiration pneumonia due to gastric secretions (HCC)   ARDS (adult respiratory distress syndrome) ([place])   1. Acute on chronic respiratory failure hypoxiapatient will continue on pressure support.  We will continue supportive measures and pulmonary toilet continue to wean per protocol. 2. ALS advanced disease we will continue with supportive care 3. ARDS slow to improve 4. Aspiration pneumonia treated we will continue with supportive care   I have personally seen and evaluated the patient, evaluated laboratory and imaging results, formulated the assessment and plan and placed orders. The Patient requires high complexity decision making for assessment and support.  Case was discussed on Rounds with the Respiratory Therapy Staff  Allyne Gee, MD [facility] Pulmonary Critical Care Medicine Sleep Medicine

## 2019-09-09 DIAGNOSIS — J690 Pneumonitis due to inhalation of food and vomit: Secondary | ICD-10-CM | POA: Diagnosis not present

## 2019-09-09 DIAGNOSIS — J80 Acute respiratory distress syndrome: Secondary | ICD-10-CM | POA: Diagnosis not present

## 2019-09-09 DIAGNOSIS — J9621 Acute and chronic respiratory failure with hypoxia: Secondary | ICD-10-CM | POA: Diagnosis not present

## 2019-09-09 DIAGNOSIS — G1221 Amyotrophic lateral sclerosis: Secondary | ICD-10-CM | POA: Diagnosis not present

## 2019-09-09 NOTE — Progress Notes (Addendum)
Pulmonary Critical Care Medicine [place]   PULMONARY CRITICAL CARE SERVICE  PROGRESS NOTE  Date of Service: 09/09/2019  Emily Moody  [id number]  DOB: 03-09-1951   DOA: 08/23/2019  Referring Physician: [name], [name] is a 68 y.o. female seen for follow up of Acute on Chronic Respiratory Failure.  Patient did really well on 2-hour goal of aerosol trach collar 28% FiO2 today.  Stayed on for 4-1/2 hours.  Now back on ventilator full support.  Medications: Reviewed on Rounds  Physical Exam:  Vitals: Pulse 74 respirations 31 BP 140/79 O2 sat 98% temp 96.9  Ventilator Settings ventilator mode AC VC rate of 18, line 400 PEEP 5 FiO2 20%  . General: Comfortable at this time . Eyes: Grossly normal lids, irises & conjunctiva . ENT: grossly tongue is normal . Neck: no obvious mass . Cardiovascular: S1 S2 normal no gallop . Respiratory: No rales or rhonchi noted . Abdomen: soft . Skin: no rash seen on limited exam . Musculoskeletal: not rigid . Psychiatric:unable to assess . Neurologic: no seizure no involuntary movements         Lab Data:   Basic Metabolic Panel: Recent Labs  Lab 09/03/19 0628  K 4.0    ABG: No results for input(s): PHART, PCO2ART, PO2ART, HCO3, O2SAT in the last 168 hours.  Liver Function Tests: No results for input(s): AST, ALT, ALKPHOS, BILITOT, PROT, ALBUMIN in the last 168 hours. No results for input(s): LIPASE, AMYLASE in the last 168 hours. No results for input(s): AMMONIA in the last 168 hours.  CBC: No results for input(s): WBC, NEUTROABS, HGB, HCT, MCV, PLT in the last 168 hours.  Cardiac Enzymes: No results for input(s): CKTOTAL, CKMB, CKMBINDEX, TROPONINI in the last 168 hours.  BNP (last 3 results) No results for input(s): BNP in the last 8760 hours.  ProBNP (last 3 results) No results for input(s): PROBNP in the last 8760 hours.  Radiological Exams: No results  found.  Assessment/Plan Active Problems:   Acute on chronic respiratory failure with hypoxia (HCC)   ALS (amyotrophic lateral sclerosis) (HCC)   Aspiration pneumonia due to gastric secretions (HCC)   ARDS (adult respiratory distress syndrome) ([place])   1. Acute on chronic respiratory failure hypoxiapatient will continue on pressure support.  We will continue supportive measures and pulmonary toilet continue to wean per protocol. 2. ALS advanced disease we will continue with supportive care 3. ARDS slow to improve 4. Aspiration pneumonia treated we will continue with supportive care   I have personally seen and evaluated the patient, evaluated laboratory and imaging results, formulated the assessment and plan and placed orders. The Patient requires high complexity decision making for assessment and support.  Case was discussed on Rounds with the Respiratory Therapy Staff  Allyne Gee, MD [facility] Pulmonary Critical Care Medicine Sleep Medicine

## 2019-09-10 DIAGNOSIS — J690 Pneumonitis due to inhalation of food and vomit: Secondary | ICD-10-CM | POA: Diagnosis not present

## 2019-09-10 DIAGNOSIS — J9621 Acute and chronic respiratory failure with hypoxia: Secondary | ICD-10-CM | POA: Diagnosis not present

## 2019-09-10 DIAGNOSIS — J80 Acute respiratory distress syndrome: Secondary | ICD-10-CM | POA: Diagnosis not present

## 2019-09-10 DIAGNOSIS — G1221 Amyotrophic lateral sclerosis: Secondary | ICD-10-CM | POA: Diagnosis not present

## 2019-09-10 NOTE — Progress Notes (Addendum)
Pulmonary Critical Care Medicine [place]   PULMONARY CRITICAL CARE SERVICE  PROGRESS NOTE  Date of Service: 09/10/2019  Emily Moody  [id number]  DOB: Jun 03, 1951   DOA: 08/23/2019  Referring Physician: [name], [name] is a 68 y.o. female seen for follow up of Acute on Chronic Respiratory Failure.  Patient had 8-hour goal today on aerosol trach collar however had increased work of breathing was unable to last more than 5 minutes.  Back on pressure support at this time.  Medications: Reviewed on Rounds  Physical Exam:  Vitals: Pulse 56 respirations 20 BP 126/69 O2 sat 1% temp 98.1  Ventilator Settings per support 12/5 FiO2 28%  . General: Comfortable at this time . Eyes: Grossly normal lids, irises & conjunctiva . ENT: grossly tongue is normal . Neck: no obvious mass . Cardiovascular: S1 S2 normal no gallop . Respiratory: No rales or rhonchi noted . Abdomen: soft . Skin: no rash seen on limited exam . Musculoskeletal: not rigid . Psychiatric:unable to assess . Neurologic: no seizure no involuntary movements         Lab Data:   Basic Metabolic Panel: No results for input(s): NA, K, CL, CO2, GLUCOSE, BUN, CREATININE, CALCIUM, MG, PHOS in the last 168 hours.  ABG: No results for input(s): PHART, PCO2ART, PO2ART, HCO3, O2SAT in the last 168 hours.  Liver Function Tests: No results for input(s): AST, ALT, ALKPHOS, BILITOT, PROT, ALBUMIN in the last 168 hours. No results for input(s): LIPASE, AMYLASE in the last 168 hours. No results for input(s): AMMONIA in the last 168 hours.  CBC: No results for input(s): WBC, NEUTROABS, HGB, HCT, MCV, PLT in the last 168 hours.  Cardiac Enzymes: No results for input(s): CKTOTAL, CKMB, CKMBINDEX, TROPONINI in the last 168 hours.  BNP (last 3 results) No results for input(s): BNP in the last 8760 hours.  ProBNP (last 3 results) No results for input(s): PROBNP in the last 8760  hours.  Radiological Exams: No results found.  Assessment/Plan Active Problems:   Acute on chronic respiratory failure with hypoxia (HCC)   ALS (amyotrophic lateral sclerosis) (HCC)   Aspiration pneumonia due to gastric secretions (HCC)   ARDS (adult respiratory distress syndrome) ([place])   1. Acute on chronic respiratory failure hypoxiapatient will continue on pressure support.  We will continue supportive measures and pulmonary toilet continue to wean per protocol. 2. ALS advanced disease we will continue with supportive care 3. ARDS slow to improve 4. Aspiration pneumonia treated we will continue with supportive care   I have personally seen and evaluated the patient, evaluated laboratory and imaging results, formulated the assessment and plan and placed orders. The Patient requires high complexity decision making for assessment and support.  Case was discussed on Rounds with the Respiratory Therapy Staff  Allyne Gee, MD [facility] Pulmonary Critical Care Medicine Sleep Medicine

## 2019-09-11 DIAGNOSIS — J80 Acute respiratory distress syndrome: Secondary | ICD-10-CM | POA: Diagnosis not present

## 2019-09-11 DIAGNOSIS — G1221 Amyotrophic lateral sclerosis: Secondary | ICD-10-CM | POA: Diagnosis not present

## 2019-09-11 DIAGNOSIS — J9621 Acute and chronic respiratory failure with hypoxia: Secondary | ICD-10-CM | POA: Diagnosis not present

## 2019-09-11 DIAGNOSIS — J690 Pneumonitis due to inhalation of food and vomit: Secondary | ICD-10-CM | POA: Diagnosis not present

## 2019-09-11 NOTE — Progress Notes (Addendum)
Pulmonary Critical Care Medicine [place]   PULMONARY CRITICAL CARE SERVICE  PROGRESS NOTE  Date of Service: 09/11/2019  SUZZETTE Moody  [id number]  DOB: 1951/08/05   DOA: 08/23/2019  Referring Physician: [name], [name] is a 68 y.o. female seen for follow up of Acute on Chronic Respiratory Failure.  Patient has an 8-hour goal today on aerosol trach collar however was unable to tolerate more than 50 minutes and is now back on pressure support 12/5 FiO2 28%  Medications: Reviewed on Rounds  Physical Exam:  Vitals: Pulse 54 respiration 28 BP 136/54 O2 sat 7% temp 97.5  Ventilator Settings pressure support 12/5 FiO2 28%  . General: Comfortable at this time . Eyes: Grossly normal lids, irises & conjunctiva . ENT: grossly tongue is normal . Neck: no obvious mass . Cardiovascular: S1 S2 normal no gallop . Respiratory: No rales or rhonchi noted . Abdomen: soft . Skin: no rash seen on limited exam . Musculoskeletal: not rigid . Psychiatric:unable to assess . Neurologic: no seizure no involuntary movements         Lab Data:   Basic Metabolic Panel: No results for input(s): NA, K, CL, CO2, GLUCOSE, BUN, CREATININE, CALCIUM, MG, PHOS in the last 168 hours.  ABG: No results for input(s): PHART, PCO2ART, PO2ART, HCO3, O2SAT in the last 168 hours.  Liver Function Tests: No results for input(s): AST, ALT, ALKPHOS, BILITOT, PROT, ALBUMIN in the last 168 hours. No results for input(s): LIPASE, AMYLASE in the last 168 hours. No results for input(s): AMMONIA in the last 168 hours.  CBC: No results for input(s): WBC, NEUTROABS, HGB, HCT, MCV, PLT in the last 168 hours.  Cardiac Enzymes: No results for input(s): CKTOTAL, CKMB, CKMBINDEX, TROPONINI in the last 168 hours.  BNP (last 3 results) No results for input(s): BNP in the last 8760 hours.  ProBNP (last 3 results) No results for input(s): PROBNP in the last 8760  hours.  Radiological Exams: No results found.  Assessment/Plan Active Problems:   Acute on chronic respiratory failure with hypoxia (HCC)   ALS (amyotrophic lateral sclerosis) (HCC)   Aspiration pneumonia due to gastric secretions (HCC)   ARDS (adult respiratory distress syndrome) ([place])   1. Acute on chronic respiratory failure hypoxiapatient will continue on pressure support. Continue to attempt weaning per protocol.  Continue supportive measures and pulmonary toilet. 2. ALS advanced disease we will continue with supportive care 3. ARDS slow to improve 4. Aspiration pneumonia treated we will continue with supportive care   I have personally seen and evaluated the patient, evaluated laboratory and imaging results, formulated the assessment and plan and placed orders. The Patient requires high complexity decision making for assessment and support.  Case was discussed on Rounds with the Respiratory Therapy Staff  Allyne Gee, MD [facility] Pulmonary Critical Care Medicine Sleep Medicine

## 2019-09-12 DIAGNOSIS — J80 Acute respiratory distress syndrome: Secondary | ICD-10-CM | POA: Diagnosis not present

## 2019-09-12 DIAGNOSIS — J690 Pneumonitis due to inhalation of food and vomit: Secondary | ICD-10-CM | POA: Diagnosis not present

## 2019-09-12 DIAGNOSIS — G1221 Amyotrophic lateral sclerosis: Secondary | ICD-10-CM | POA: Diagnosis not present

## 2019-09-12 DIAGNOSIS — J9621 Acute and chronic respiratory failure with hypoxia: Secondary | ICD-10-CM | POA: Diagnosis not present

## 2019-09-12 LAB — GROUP A STREP BY PCR: Group A Strep by PCR: NOT DETECTED

## 2019-09-12 LAB — CBC
HCT: 31.4 % — ABNORMAL LOW (ref 36.0–46.0)
Hemoglobin: 10 g/dL — ABNORMAL LOW (ref 12.0–15.0)
MCH: 30.1 pg (ref 26.0–34.0)
MCHC: 31.8 g/dL (ref 30.0–36.0)
MCV: 94.6 fL (ref 80.0–100.0)
Platelets: 125 10*3/uL — ABNORMAL LOW (ref 150–400)
RBC: 3.32 MIL/uL — ABNORMAL LOW (ref 3.87–5.11)
RDW: 13.1 % (ref 11.5–15.5)
WBC: 7.3 10*3/uL (ref 4.0–10.5)
nRBC: 0 % (ref 0.0–0.2)

## 2019-09-12 LAB — BASIC METABOLIC PANEL
Anion gap: 13 (ref 5–15)
BUN: 22 mg/dL (ref 8–23)
CO2: 29 mmol/L (ref 22–32)
Calcium: 9.3 mg/dL (ref 8.9–10.3)
Chloride: 104 mmol/L (ref 98–111)
Creatinine, Ser: 0.37 mg/dL — ABNORMAL LOW (ref 0.44–1.00)
GFR calc Af Amer: >60 mL/min (ref >60)
GFR calc non Af Amer: >60 mL/min (ref >60)
Glucose, Bld: 192 mg/dL — ABNORMAL HIGH (ref 70–99)
Potassium: 3.4 mmol/L — ABNORMAL LOW (ref 3.5–5.1)
Sodium: 146 mmol/L — ABNORMAL HIGH (ref 135–145)

## 2019-09-12 NOTE — Progress Notes (Addendum)
Pulmonary Critical Care Medicine [place]   PULMONARY CRITICAL CARE SERVICE  PROGRESS NOTE  Date of Service: 09/12/2019  KEMANI HEIDEL  [id number]  DOB: 12/20/1950   DOA: 08/23/2019  Referring Physician: [name], [name] is a 68 y.o. female seen for follow up of Acute on Chronic Respiratory Failure.  Patient was able to 1 hour and 50 minutes today on aerosol trach collar before increased work of breathing and desaturation because of back on pressure support 12/5 with FiO2 28%.  Medications: Reviewed on Rounds  Physical Exam:  Vitals: Pulse 89 respirations 25 BP 141/79 O2 sat 98% temp 90.2  Ventilator Settings pressure support 12/5 FiO2 28%  . General: Comfortable at this time . Eyes: Grossly normal lids, irises & conjunctiva . ENT: grossly tongue is normal . Neck: no obvious mass . Cardiovascular: S1 S2 normal no gallop . Respiratory: No rales or rhonchi noted . Abdomen: soft . Skin: no rash seen on limited exam . Musculoskeletal: not rigid . Psychiatric:unable to assess . Neurologic: no seizure no involuntary movements         Lab Data:   Basic Metabolic Panel: Recent Labs  Lab 09/12/19 0358  NA 146*  K 3.4*  CL 104  CO2 29  GLUCOSE 192*  BUN 22  CREATININE 0.37*  CALCIUM 9.3    ABG: No results for input(s): PHART, PCO2ART, PO2ART, HCO3, O2SAT in the last 168 hours.  Liver Function Tests: No results for input(s): AST, ALT, ALKPHOS, BILITOT, PROT, ALBUMIN in the last 168 hours. No results for input(s): LIPASE, AMYLASE in the last 168 hours. No results for input(s): AMMONIA in the last 168 hours.  CBC: Recent Labs  Lab 09/12/19 0358  WBC 7.3  HGB 10.0*  HCT 31.4*  MCV 94.6  PLT 125*    Cardiac Enzymes: No results for input(s): CKTOTAL, CKMB, CKMBINDEX, TROPONINI in the last 168 hours.  BNP (last 3 results) No results for input(s): BNP in the last 8760 hours.  ProBNP (last 3 results) No results for  input(s): PROBNP in the last 8760 hours.  Radiological Exams: No results found.  Assessment/Plan Active Problems:   Acute on chronic respiratory failure with hypoxia (HCC)   ALS (amyotrophic lateral sclerosis) (HCC)   Aspiration pneumonia due to gastric secretions (HCC)   ARDS (adult respiratory distress syndrome) ([place])   1. Acute on chronic respiratory failure hypoxiapatient will continue on pressure support. Continue to attempt weaning per protocol.  Continue supportive measures and pulmonary toilet. 2. ALS advanced disease we will continue with supportive care 3. ARDS slow to improve 4. Aspiration pneumonia treated we will continue with supportive care   I have personally seen and evaluated the patient, evaluated laboratory and imaging results, formulated the assessment and plan and placed orders. The Patient requires high complexity decision making for assessment and support.  Case was discussed on Rounds with the Respiratory Therapy Staff  Allyne Gee, MD [facility] Pulmonary Critical Care Medicine Sleep Medicine

## 2019-09-13 DIAGNOSIS — J690 Pneumonitis due to inhalation of food and vomit: Secondary | ICD-10-CM | POA: Diagnosis not present

## 2019-09-13 DIAGNOSIS — J80 Acute respiratory distress syndrome: Secondary | ICD-10-CM | POA: Diagnosis not present

## 2019-09-13 DIAGNOSIS — J9621 Acute and chronic respiratory failure with hypoxia: Secondary | ICD-10-CM | POA: Diagnosis not present

## 2019-09-13 DIAGNOSIS — G1221 Amyotrophic lateral sclerosis: Secondary | ICD-10-CM | POA: Diagnosis not present

## 2019-09-13 LAB — BASIC METABOLIC PANEL
Anion gap: 12 (ref 5–15)
BUN: 18 mg/dL (ref 8–23)
CO2: 29 mmol/L (ref 22–32)
Calcium: 9.4 mg/dL (ref 8.9–10.3)
Chloride: 104 mmol/L (ref 98–111)
Creatinine, Ser: 0.31 mg/dL — ABNORMAL LOW (ref 0.44–1.00)
GFR calc Af Amer: >60 mL/min (ref >60)
GFR calc non Af Amer: >60 mL/min (ref >60)
Glucose, Bld: 140 mg/dL — ABNORMAL HIGH (ref 70–99)
Potassium: 3.5 mmol/L (ref 3.5–5.1)
Sodium: 145 mmol/L (ref 135–145)

## 2019-09-13 NOTE — Progress Notes (Addendum)
Pulmonary Critical Care Medicine [place]   PULMONARY CRITICAL CARE SERVICE  PROGRESS NOTE  Date of Service: 09/13/2019  Emily Moody  [id number]  DOB: 12-29-1950   DOA: 08/23/2019  Referring Physician: [name], [name] is a 68 y.o. female seen for follow up of Acute on Chronic Respiratory Failure.  Patient remains on pressure support 14/5 FiO2 28% for 16-hour goal today.  Medications: Reviewed on Rounds  Physical Exam:  Vitals: Pulse 60 respirations 29 BP 153/82 O2 sat 90% temp 37.5  Ventilator Settings pressure 14/5 FiO2 28%  . General: Comfortable at this time . Eyes: Grossly normal lids, irises & conjunctiva . ENT: grossly tongue is normal . Neck: no obvious mass . Cardiovascular: S1 S2 normal no gallop . Respiratory: No rales or rhonchi noted . Abdomen: soft . Skin: no rash seen on limited exam . Musculoskeletal: not rigid . Psychiatric:unable to assess . Neurologic: no seizure no involuntary movements         Lab Data:   Basic Metabolic Panel: Recent Labs  Lab 09/12/19 0358 09/13/19 0900  NA 146* 145  K 3.4* 3.5  CL 104 104  CO2 29 29  GLUCOSE 192* 140*  BUN 22 18  CREATININE 0.37* 0.31*  CALCIUM 9.3 9.4    ABG: No results for input(s): PHART, PCO2ART, PO2ART, HCO3, O2SAT in the last 168 hours.  Liver Function Tests: No results for input(s): AST, ALT, ALKPHOS, BILITOT, PROT, ALBUMIN in the last 168 hours. No results for input(s): LIPASE, AMYLASE in the last 168 hours. No results for input(s): AMMONIA in the last 168 hours.  CBC: Recent Labs  Lab 09/12/19 0358  WBC 7.3  HGB 10.0*  HCT 31.4*  MCV 94.6  PLT 125*    Cardiac Enzymes: No results for input(s): CKTOTAL, CKMB, CKMBINDEX, TROPONINI in the last 168 hours.  BNP (last 3 results) No results for input(s): BNP in the last 8760 hours.  ProBNP (last 3 results) No results for input(s): PROBNP in the last 8760 hours.  Radiological Exams: No  results found.  Assessment/Plan Active Problems:   Acute on chronic respiratory failure with hypoxia (HCC)   ALS (amyotrophic lateral sclerosis) (HCC)   Aspiration pneumonia due to gastric secretions (HCC)   ARDS (adult respiratory distress syndrome) ([place])   1. Acute on chronic respiratory failure hypoxiapatient will continue on pressure support. Continue to attempt weaning per protocol.  Continue supportive measures and pulmonary toilet. 2. ALS advanced disease we will continue with supportive care 3. ARDS slow to improve 4. Aspiration pneumonia treated we will continue with supportive care   I have personally seen and evaluated the patient, evaluated laboratory and imaging results, formulated the assessment and plan and placed orders. The Patient requires high complexity decision making for assessment and support.  Case was discussed on Rounds with the Respiratory Therapy Staff  Allyne Gee, MD [facility] Pulmonary Critical Care Medicine Sleep Medicine

## 2019-09-16 DIAGNOSIS — J80 Acute respiratory distress syndrome: Secondary | ICD-10-CM | POA: Diagnosis not present

## 2019-09-16 DIAGNOSIS — J690 Pneumonitis due to inhalation of food and vomit: Secondary | ICD-10-CM | POA: Diagnosis not present

## 2019-09-16 DIAGNOSIS — G1221 Amyotrophic lateral sclerosis: Secondary | ICD-10-CM | POA: Diagnosis not present

## 2019-09-16 DIAGNOSIS — J9621 Acute and chronic respiratory failure with hypoxia: Secondary | ICD-10-CM | POA: Diagnosis not present

## 2019-09-16 NOTE — Progress Notes (Signed)
Pulmonary Critical Care Medicine [place]   PULMONARY CRITICAL CARE SERVICE  PROGRESS NOTE  Date of Service: 09/16/2019  Emily Moody  [id number]  DOB: May 15, 1951   DOA: 08/23/2019  Referring Physician: [name], [name] is a 68 y.o. female seen for follow up of Acute on Chronic Respiratory Failure.  Patient is currently on pressure support mode has been on 35% FiO2 with a goal of 16 hours  Medications: Reviewed on Rounds  Physical Exam:  Vitals: Temperature 97.0 pulse 58 respiratory 30 blood pressure 149/57 saturations 99%  Ventilator Settings mode ventilation pressure support FiO2 35% pressure 12 PEEP 5  . General: Comfortable at this time . Eyes: Grossly normal lids, irises & conjunctiva . ENT: grossly tongue is normal . Neck: no obvious mass . Cardiovascular: S1 S2 normal no gallop . Respiratory: No rhonchi coarse breath sounds . Abdomen: soft . Skin: no rash seen on limited exam . Musculoskeletal: not rigid . Psychiatric:unable to assess . Neurologic: no seizure no involuntary movements         Lab Data:   Basic Metabolic Panel: Recent Labs  Lab 09/12/19 0358 09/13/19 0900  NA 146* 145  K 3.4* 3.5  CL 104 104  CO2 29 29  GLUCOSE 192* 140*  BUN 22 18  CREATININE 0.37* 0.31*  CALCIUM 9.3 9.4    ABG: No results for input(s): PHART, PCO2ART, PO2ART, HCO3, O2SAT in the last 168 hours.  Liver Function Tests: No results for input(s): AST, ALT, ALKPHOS, BILITOT, PROT, ALBUMIN in the last 168 hours. No results for input(s): LIPASE, AMYLASE in the last 168 hours. No results for input(s): AMMONIA in the last 168 hours.  CBC: Recent Labs  Lab 09/12/19 0358  WBC 7.3  HGB 10.0*  HCT 31.4*  MCV 94.6  PLT 125*    Cardiac Enzymes: No results for input(s): CKTOTAL, CKMB, CKMBINDEX, TROPONINI in the last 168 hours.  BNP (last 3 results) No results for input(s): BNP in the last 8760 hours.  ProBNP (last 3  results) No results for input(s): PROBNP in the last 8760 hours.  Radiological Exams: No results found.  Assessment/Plan Active Problems:   Acute on chronic respiratory failure with hypoxia (HCC)   ALS (amyotrophic lateral sclerosis) (HCC)   Aspiration pneumonia due to gastric secretions (HCC)   ARDS (adult respiratory distress syndrome) ([place])   1. Acute on chronic respiratory failure hypoxia plan is to continue with pressure support with a goal of 16 hours patient is not able to liberate from the ventilator for full 24 hours 2. ALS advanced disease and progressive 3. Aspiration pneumonia treated we will continue supportive care 4. ARDS at baseline we will continue present management   I have personally seen and evaluated the patient, evaluated laboratory and imaging results, formulated the assessment and plan and placed orders. The Patient requires high complexity decision making for assessment and support.  Case was discussed on Rounds with the Respiratory Therapy Staff  Allyne Gee, MD [facility] Pulmonary Critical Care Medicine Sleep Medicine

## 2019-09-17 DIAGNOSIS — J690 Pneumonitis due to inhalation of food and vomit: Secondary | ICD-10-CM | POA: Diagnosis not present

## 2019-09-17 DIAGNOSIS — G1221 Amyotrophic lateral sclerosis: Secondary | ICD-10-CM | POA: Diagnosis not present

## 2019-09-17 DIAGNOSIS — J9621 Acute and chronic respiratory failure with hypoxia: Secondary | ICD-10-CM | POA: Diagnosis not present

## 2019-09-17 DIAGNOSIS — J80 Acute respiratory distress syndrome: Secondary | ICD-10-CM | POA: Diagnosis not present

## 2019-09-17 LAB — BASIC METABOLIC PANEL
Anion gap: 6 (ref 5–15)
BUN: 22 mg/dL (ref 8–23)
CO2: 31 mmol/L (ref 22–32)
Calcium: 8.6 mg/dL — ABNORMAL LOW (ref 8.9–10.3)
Chloride: 103 mmol/L (ref 98–111)
Creatinine, Ser: 0.34 mg/dL — ABNORMAL LOW (ref 0.44–1.00)
GFR calc Af Amer: >60 mL/min (ref >60)
GFR calc non Af Amer: >60 mL/min (ref >60)
Glucose, Bld: 198 mg/dL — ABNORMAL HIGH (ref 70–99)
Potassium: 3.9 mmol/L (ref 3.5–5.1)
Sodium: 140 mmol/L (ref 135–145)

## 2019-09-17 NOTE — Progress Notes (Signed)
Pulmonary Critical Care Medicine [place]   PULMONARY CRITICAL CARE SERVICE  PROGRESS NOTE  Date of Service: 09/17/2019  Emily Moody  TIW:[id number]  DOB: 02/02/1951   DOA: 08/23/2019  Referring Physician: [name], [name] is a 68 y.o. female seen for follow up of Acute on Chronic Respiratory Failure.  Patient currently is on pressure support today supposed to start on T collar weaning  Medications: Reviewed on Rounds  Physical Exam:  Vitals: Temperature 97.0 pulse 54 respiratory 20 blood pressure 137/74 saturations 98%  Ventilator Settings patient was on pressure support mode 12/5  . General: Comfortable at this time . Eyes: Grossly normal lids, irises & conjunctiva . ENT: grossly tongue is normal . Neck: no obvious mass . Cardiovascular: S1 S2 normal no gallop . Respiratory: No rhonchi no rales are noted at this time . Abdomen: soft . Skin: no rash seen on limited exam . Musculoskeletal: not rigid . Psychiatric:unable to assess . Neurologic: no seizure no involuntary movements         Lab Data:   Basic Metabolic Panel: Recent Labs  Lab 09/12/19 0358 09/13/19 0900 09/17/19 0536  NA 146* 145 140  K 3.4* 3.5 3.9  CL 104 104 103  CO2 29 29 31   GLUCOSE 192* 140* 198*  BUN 22 18 22   CREATININE 0.37* 0.31* 0.34*  CALCIUM 9.3 9.4 8.6*    ABG: No results for input(s): PHART, PCO2ART, PO2ART, HCO3, O2SAT in the last 168 hours.  Liver Function Tests: No results for input(s): AST, ALT, ALKPHOS, BILITOT, PROT, ALBUMIN in the last 168 hours. No results for input(s): LIPASE, AMYLASE in the last 168 hours. No results for input(s): AMMONIA in the last 168 hours.  CBC: Recent Labs  Lab 09/12/19 0358  WBC 7.3  HGB 10.0*  HCT 31.4*  MCV 94.6  PLT 125*    Cardiac Enzymes: No results for input(s): CKTOTAL, CKMB, CKMBINDEX, TROPONINI in the last 168 hours.  BNP (last 3 results) No results for input(s): BNP in the last  8760 hours.  ProBNP (last 3 results) No results for input(s): PROBNP in the last 8760 hours.  Radiological Exams: No results found.  Assessment/Plan Active Problems:   Acute on chronic respiratory failure with hypoxia (HCC)   ALS (amyotrophic lateral sclerosis) (HCC)   Aspiration pneumonia due to gastric secretions (HCC)   ARDS (adult respiratory distress syndrome) ([place])   1. Acute on chronic respiratory failure with hypoxia plan is to continue with T collar trials will continue today however with ALS we need to give some rest on the ventilator 2. ALS advanced disease patient will need nocturnal vent support 3. Aspiration pneumonia treated 4. ARDS slowly improving   I have personally seen and evaluated the patient, evaluated laboratory and imaging results, formulated the assessment and plan and placed orders. The Patient requires high complexity decision making for assessment and support.  Case was discussed on Rounds with the Respiratory Therapy Staff  Allyne Gee, MD [facility] Pulmonary Critical Care Medicine Sleep Medicine

## 2019-09-18 DIAGNOSIS — J690 Pneumonitis due to inhalation of food and vomit: Secondary | ICD-10-CM | POA: Diagnosis not present

## 2019-09-18 DIAGNOSIS — J80 Acute respiratory distress syndrome: Secondary | ICD-10-CM | POA: Diagnosis not present

## 2019-09-18 DIAGNOSIS — G1221 Amyotrophic lateral sclerosis: Secondary | ICD-10-CM | POA: Diagnosis not present

## 2019-09-18 DIAGNOSIS — J9621 Acute and chronic respiratory failure with hypoxia: Secondary | ICD-10-CM | POA: Diagnosis not present

## 2019-09-18 NOTE — Progress Notes (Signed)
Pulmonary Critical Care Medicine [place]   PULMONARY CRITICAL CARE SERVICE  PROGRESS NOTE  Date of Service: 09/18/2019  Emily Moody  HWE:[id number]  DOB: 1951-05-21   DOA: 08/23/2019  Referring Physician: [name], [name] is a 68 y.o. female seen for follow up of Acute on Chronic Respiratory Failure.  Patient was attempted on T collar today but did not tolerate it was placed back on pressure support right now resting on 12/5 pressure support  Medications: Reviewed on Rounds  Physical Exam:  Vitals: Temperature 97.4 pulse 60 respiratory rate 16 blood pressure 148/87 saturations 96%  Ventilator Settings mode ventilation pressure support FiO2 28% pressure 12 PEEP 5 tidal volume is 373  . General: Comfortable at this time . Eyes: Grossly normal lids, irises & conjunctiva . ENT: grossly tongue is normal . Neck: no obvious mass . Cardiovascular: S1 S2 normal no gallop . Respiratory: No rhonchi coarse breath sounds . Abdomen: soft . Skin: no rash seen on limited exam . Musculoskeletal: not rigid . Psychiatric:unable to assess . Neurologic: no seizure no involuntary movements         Lab Data:   Basic Metabolic Panel: Recent Labs  Lab 09/12/19 0358 09/13/19 0900 09/17/19 0536  NA 146* 145 140  K 3.4* 3.5 3.9  CL 104 104 103  CO2 29 29 31   GLUCOSE 192* 140* 198*  BUN 22 18 22   CREATININE 0.37* 0.31* 0.34*  CALCIUM 9.3 9.4 8.6*    ABG: No results for input(s): PHART, PCO2ART, PO2ART, HCO3, O2SAT in the last 168 hours.  Liver Function Tests: No results for input(s): AST, ALT, ALKPHOS, BILITOT, PROT, ALBUMIN in the last 168 hours. No results for input(s): LIPASE, AMYLASE in the last 168 hours. No results for input(s): AMMONIA in the last 168 hours.  CBC: Recent Labs  Lab 09/12/19 0358  WBC 7.3  HGB 10.0*  HCT 31.4*  MCV 94.6  PLT 125*    Cardiac Enzymes: No results for input(s): CKTOTAL, CKMB, CKMBINDEX, TROPONINI  in the last 168 hours.  BNP (last 3 results) No results for input(s): BNP in the last 8760 hours.  ProBNP (last 3 results) No results for input(s): PROBNP in the last 8760 hours.  Radiological Exams: No results found.  Assessment/Plan Active Problems:   Acute on chronic respiratory failure with hypoxia (HCC)   ALS (amyotrophic lateral sclerosis) (HCC)   Aspiration pneumonia due to gastric secretions (HCC)   ARDS (adult respiratory distress syndrome) ([place])   1. Acute on chronic respiratory failure with hypoxia plan is to continue with pressure support mode titrate oxygen continue pulmonary toilet. 2. ALS advanced disease as above patient has not been tolerating T collar and as already has been discussed and determined she will require ventilatory support 3. Aspiration pneumonia treated we will continue to follow 4. ARDS clinically resolving   I have personally seen and evaluated the patient, evaluated laboratory and imaging results, formulated the assessment and plan and placed orders. The Patient requires high complexity decision making for assessment and support.  Case was discussed on Rounds with the Respiratory Therapy Staff  Allyne Gee, MD [facility] Pulmonary Critical Care Medicine Sleep Medicine

## 2019-09-19 DIAGNOSIS — J9621 Acute and chronic respiratory failure with hypoxia: Secondary | ICD-10-CM | POA: Diagnosis not present

## 2019-09-19 DIAGNOSIS — J690 Pneumonitis due to inhalation of food and vomit: Secondary | ICD-10-CM | POA: Diagnosis not present

## 2019-09-19 DIAGNOSIS — J80 Acute respiratory distress syndrome: Secondary | ICD-10-CM | POA: Diagnosis not present

## 2019-09-19 DIAGNOSIS — G1221 Amyotrophic lateral sclerosis: Secondary | ICD-10-CM | POA: Diagnosis not present

## 2019-09-19 NOTE — Progress Notes (Signed)
Pulmonary Critical Care Medicine [place]   PULMONARY CRITICAL CARE SERVICE  PROGRESS NOTE  Date of Service: 09/19/2019  OSCEOLA Moody  QIW:[id number]  DOB: 09-25-1951   DOA: 08/23/2019  Referring Physician: [name], [name] is a 68 y.o. female seen for follow up of Acute on Chronic Respiratory Failure.  Patient is on full support on the ventilator.  She has not been tolerating weaning attempts in fact has been regressing as far as being able to do any weaning at all  Medications: Reviewed on Rounds  Physical Exam:  Vitals: Temperature 98.1 pulse 69 respiratory 22 blood pressure 122/71 saturations 95%  Ventilator Settings off the ventilator right now on assist control currently on 40% FiO2 tidal volume 400 with a PEEP of 5  . General: Comfortable at this time . Eyes: Grossly normal lids, irises & conjunctiva . ENT: grossly tongue is normal . Neck: no obvious mass . Cardiovascular: S1 S2 normal no gallop . Respiratory: No rhonchi no rales are noted at this time . Abdomen: soft . Skin: no rash seen on limited exam . Musculoskeletal: not rigid . Psychiatric:unable to assess . Neurologic: no seizure no involuntary movements         Lab Data:   Basic Metabolic Panel: Recent Labs  Lab 09/13/19 0900 09/17/19 0536  NA 145 140  K 3.5 3.9  CL 104 103  CO2 29 31  GLUCOSE 140* 198*  BUN 18 22  CREATININE 0.31* 0.34*  CALCIUM 9.4 8.6*    ABG: No results for input(s): PHART, PCO2ART, PO2ART, HCO3, O2SAT in the last 168 hours.  Liver Function Tests: No results for input(s): AST, ALT, ALKPHOS, BILITOT, PROT, ALBUMIN in the last 168 hours. No results for input(s): LIPASE, AMYLASE in the last 168 hours. No results for input(s): AMMONIA in the last 168 hours.  CBC: No results for input(s): WBC, NEUTROABS, HGB, HCT, MCV, PLT in the last 168 hours.  Cardiac Enzymes: No results for input(s): CKTOTAL, CKMB, CKMBINDEX, TROPONINI in the  last 168 hours.  BNP (last 3 results) No results for input(s): BNP in the last 8760 hours.  ProBNP (last 3 results) No results for input(s): PROBNP in the last 8760 hours.  Radiological Exams: No results found.  Assessment/Plan Active Problems:   Acute on chronic respiratory failure with hypoxia (HCC)   ALS (amyotrophic lateral sclerosis) (HCC)   Aspiration pneumonia due to gastric secretions (HCC)   ARDS (adult respiratory distress syndrome) ([place])   1. Acute on chronic respiratory failure with hypoxia we will continue with full support on the ventilator on assist control mode patient is on 40% FiO2 time 400 PEEP 5 2. ALS advanced disease now is not able to tolerate any weaning 3. Aspiration pneumonia treated improving 4. ARDS improving   I have personally seen and evaluated the patient, evaluated laboratory and imaging results, formulated the assessment and plan and placed orders. The Patient requires high complexity decision making for assessment and support.  Case was discussed on Rounds with the Respiratory Therapy Staff  Allyne Gee, MD [facility] Pulmonary Critical Care Medicine Sleep Medicine

## 2019-09-20 ENCOUNTER — Other Ambulatory Visit (HOSPITAL_COMMUNITY): Payer: Medicare Other

## 2019-09-20 DIAGNOSIS — J9621 Acute and chronic respiratory failure with hypoxia: Secondary | ICD-10-CM | POA: Diagnosis not present

## 2019-09-20 DIAGNOSIS — J80 Acute respiratory distress syndrome: Secondary | ICD-10-CM | POA: Diagnosis not present

## 2019-09-20 DIAGNOSIS — G1221 Amyotrophic lateral sclerosis: Secondary | ICD-10-CM | POA: Diagnosis not present

## 2019-09-20 DIAGNOSIS — J690 Pneumonitis due to inhalation of food and vomit: Secondary | ICD-10-CM | POA: Diagnosis not present

## 2019-09-20 LAB — BRAIN NATRIURETIC PEPTIDE: B Natriuretic Peptide: 60.7 pg/mL (ref 0.0–100.0)

## 2019-09-20 LAB — NOVEL CORONAVIRUS, NAA (HOSP ORDER, SEND-OUT TO REF LAB; TAT 18-24 HRS): SARS-CoV-2, NAA: NOT DETECTED

## 2019-09-20 MED ORDER — GENERIC EXTERNAL MEDICATION
Status: DC
Start: ? — End: 2019-09-20

## 2019-09-20 NOTE — Progress Notes (Addendum)
Pulmonary Critical Care Medicine [place]   PULMONARY CRITICAL CARE SERVICE  PROGRESS NOTE  Date of Service: 09/20/2019  LIZANIA BOUCHARD  TKZ:[id number]  DOB: September 15, 1951   DOA: 08/23/2019  Referring Physician: [name], [name] is a 68 y.o. female seen for follow up of Acute on Chronic Respiratory Failure.  Patient failed pressure support today after 5 minutes of arrival.  Ventilator.  Medications: Reviewed on Rounds  Physical Exam:  Vitals: Pulse 67 respirations 24 BP 120/67 O2 sat 98% 28.2  Ventilator Settings AC VC rate of 18 tidal volume 400 PEEP of 5 FiO2 40%  . General: Comfortable at this time . Eyes: Grossly normal lids, irises & conjunctiva . ENT: grossly tongue is normal . Neck: no obvious mass . Cardiovascular: S1 S2 normal no gallop . Respiratory: No rales or rhonchi noted . Abdomen: soft . Skin: no rash seen on limited exam . Musculoskeletal: not rigid . Psychiatric:unable to assess . Neurologic: no seizure no involuntary movements         Lab Data:   Basic Metabolic Panel: Recent Labs  Lab 09/17/19 0536  NA 140  K 3.9  CL 103  CO2 31  GLUCOSE 198*  BUN 22  CREATININE 0.34*  CALCIUM 8.6*    ABG: No results for input(s): PHART, PCO2ART, PO2ART, HCO3, O2SAT in the last 168 hours.  Liver Function Tests: No results for input(s): AST, ALT, ALKPHOS, BILITOT, PROT, ALBUMIN in the last 168 hours. No results for input(s): LIPASE, AMYLASE in the last 168 hours. No results for input(s): AMMONIA in the last 168 hours.  CBC: No results for input(s): WBC, NEUTROABS, HGB, HCT, MCV, PLT in the last 168 hours.  Cardiac Enzymes: No results for input(s): CKTOTAL, CKMB, CKMBINDEX, TROPONINI in the last 168 hours.  BNP (last 3 results) Recent Labs    09/20/19 1137  BNP 60.7    ProBNP (last 3 results) No results for input(s): PROBNP in the last 8760 hours.  Radiological Exams: Dg Chest Port 1 View  Result Date:  09/20/2019 CLINICAL DATA:  Respiratory failure EXAM: PORTABLE CHEST 1 VIEW COMPARISON:  09/02/2019 FINDINGS: Tracheostomy is in stable position. Low lung volumes. Bibasilar atelectasis. Cardiomegaly. No overt edema or acute bony abnormality. IMPRESSION: Tracheostomy in stable position. Cardiomegaly. Low volumes, bibasilar atelectasis. Electronically Signed   By: Rolm Baptise M.D.   On: 09/20/2019 11:48    Assessment/Plan Active Problems:   Acute on chronic respiratory failure with hypoxia (HCC)   ALS (amyotrophic lateral sclerosis) (HCC)   Aspiration pneumonia due to gastric secretions (HCC)   ARDS (adult respiratory distress syndrome) ([place])   1. Acute on chronic respiratory failure with hypoxia we will continue with full support on the ventilator on assist control mode patient is on 40% FiO2 time 400 PEEP 5 2. ALS advanced disease now is not able to tolerate any weaning 3. Aspiration pneumonia treated improving 4. ARDS improving   I have personally seen and evaluated the patient, evaluated laboratory and imaging results, formulated the assessment and plan and placed orders. The Patient requires high complexity decision making for assessment and support.  Case was discussed on Rounds with the Respiratory Therapy Staff  Allyne Gee, MD [facility] Pulmonary Critical Care Medicine Sleep Medicine

## 2019-09-21 NOTE — Progress Notes (Signed)
Pulmonary Critical Care Medicine [place]   PULMONARY CRITICAL CARE SERVICE  PROGRESS NOTE  Date of Service: 09/21/2019  Emily Moody  WVP:[id number]  DOB: 1951-08-21   DOA: 08/23/2019  Referring Physician: [name], [name] is a 68 y.o. female seen for follow up of Acute on Chronic Respiratory Failure.  Patient was able to tenderness today on pressure support and full support on the ventilator.  Medications: Reviewed on Rounds  Physical Exam:  Vitals: Pulse 58 R 18 BP 99/59 O2 sat 99% temp 98.7  Ventilator Settings ventilator mode AC VC rate of 18 tidal line 400 PEEP of 5 and FiO2 40%  . General: Comfortable at this time . Eyes: Grossly normal lids, irises & conjunctiva . ENT: grossly tongue is normal . Neck: no obvious mass . Cardiovascular: S1 S2 normal no gallop . Respiratory: No rales or rhonchi noted . Abdomen: soft . Skin: no rash seen on limited exam . Musculoskeletal: not rigid . Psychiatric:unable to assess . Neurologic: no seizure no involuntary movements         Lab Data:   Basic Metabolic Panel: Recent Labs  Lab 09/17/19 0536  NA 140  K 3.9  CL 103  CO2 31  GLUCOSE 198*  BUN 22  CREATININE 0.34*  CALCIUM 8.6*    ABG: No results for input(s): PHART, PCO2ART, PO2ART, HCO3, O2SAT in the last 168 hours.  Liver Function Tests: No results for input(s): AST, ALT, ALKPHOS, BILITOT, PROT, ALBUMIN in the last 168 hours. No results for input(s): LIPASE, AMYLASE in the last 168 hours. No results for input(s): AMMONIA in the last 168 hours.  CBC: No results for input(s): WBC, NEUTROABS, HGB, HCT, MCV, PLT in the last 168 hours.  Cardiac Enzymes: No results for input(s): CKTOTAL, CKMB, CKMBINDEX, TROPONINI in the last 168 hours.  BNP (last 3 results) Recent Labs    09/20/19 1137  BNP 60.7    ProBNP (last 3 results) No results for input(s): PROBNP in the last 8760 hours.  Radiological Exams: Dg Chest  Port 1 View  Result Date: 09/20/2019 CLINICAL DATA:  Respiratory failure EXAM: PORTABLE CHEST 1 VIEW COMPARISON:  09/02/2019 FINDINGS: Tracheostomy is in stable position. Low lung volumes. Bibasilar atelectasis. Cardiomegaly. No overt edema or acute bony abnormality. IMPRESSION: Tracheostomy in stable position. Cardiomegaly. Low volumes, bibasilar atelectasis. Electronically Signed   By: Rolm Baptise M.D.   On: 09/20/2019 11:48    Assessment/Plan Active Problems:   Acute on chronic respiratory failure with hypoxia (HCC)   ALS (amyotrophic lateral sclerosis) (HCC)   Aspiration pneumonia due to gastric secretions (HCC)   ARDS (adult respiratory distress syndrome) ([place])   1. Acute on chronic respiratory failure with hypoxia patient will continue on full support on assist control mode rate of 18 tidal line 400 PEEP of 5 FiO2 40%.  Only able to do 10 minutes of pressure support today we will continue to attempt weaning future as patient can tolerate.  Continue supportive measures and pulmonary toilet. 2. ALS advanced disease now is not able to tolerate any weaning 3. Aspiration pneumonia treated improving 4. ARDS improving   I have personally seen and evaluated the patient, evaluated laboratory and imaging results, formulated the assessment and plan and placed orders. The Patient requires high complexity decision making for assessment and support.  Case was discussed on Rounds with the Respiratory Therapy Staff  Allyne Gee, MD [facility] Pulmonary Critical Care Medicine Sleep Medicine

## 2019-09-23 ENCOUNTER — Ambulatory Visit (HOSPITAL_COMMUNITY)
Admission: AD | Admit: 2019-09-23 | Discharge: 2019-09-23 | Disposition: A | Discharge status: Home / Self Care | Payer: Medicare Other | Source: Other Acute Inpatient Hospital | Attending: Internal Medicine | Admitting: Internal Medicine

## 2019-09-23 DIAGNOSIS — Z9911 Dependence on respirator [ventilator] status: Secondary | ICD-10-CM | POA: Diagnosis present

## 2019-09-23 NOTE — Progress Notes (Signed)
Pulmonary Critical Care Medicine [place]   PULMONARY CRITICAL CARE SERVICE  PROGRESS NOTE  Date of Service: 09/23/2019  [name]  JSH:[id number]  DOB: Oct 10, 1951   DOA: 08/23/2019  Referring Physician: [name], [name] is a 68 y.o. female seen for follow up of Acute on Chronic Respiratory Failure.  Patient remains on full support ventilator syndrome or interacting with FiO2 4%.  Medications: Reviewed on Rounds  Physical Exam:  Vitals: Pulse 78 respirations 23 BP 126/71 O2 72% temp 98.6  Ventilator Settings ventilator mode AC VC rate of 18,400 PEEP of 5 and FiO2 40%.  . General: Comfortable at this time . Eyes: Grossly normal lids, irises & conjunctiva . ENT: grossly tongue is normal . Neck: no obvious mass . Cardiovascular: S1 S2 normal no gallop . Respiratory: No rales or rhonchi noted . Abdomen: soft . Skin: no rash seen on limited exam . Musculoskeletal: not rigid . Psychiatric:unable to assess . Neurologic: no seizure no involuntary movements         Lab Data:   Basic Metabolic Panel: Recent Labs  Lab 09/17/19 0536  NA 140  K 3.9  CL 103  CO2 31  GLUCOSE 198*  BUN 22  CREATININE 0.34*  CALCIUM 8.6*    ABG: No results for input(s): PHART, PCO2ART, PO2ART, HCO3, O2SAT in the last 168 hours.  Liver Function Tests: No results for input(s): AST, ALT, ALKPHOS, BILITOT, PROT, ALBUMIN in the last 168 hours. No results for input(s): LIPASE, AMYLASE in the last 168 hours. No results for input(s): AMMONIA in the last 168 hours.  CBC: No results for input(s): WBC, NEUTROABS, HGB, HCT, MCV, PLT in the last 168 hours.  Cardiac Enzymes: No results for input(s): CKTOTAL, CKMB, CKMBINDEX, TROPONINI in the last 168 hours.  BNP (last 3 results) Recent Labs    09/20/19 1137  BNP 60.7    ProBNP (last 3 results) No results for input(s): PROBNP in the last 8760 hours.  Radiological Exams: No results  found.  Assessment/Plan Active Problems:   Acute on chronic respiratory failure with hypoxia (HCC)   ALS (amyotrophic lateral sclerosis) (HCC)   Aspiration pneumonia due to gastric secretions (HCC)   ARDS (adult respiratory distress syndrome) ([place])   1. Acute on chronic respiratory failure with hypoxia continue with full support ventilator.  Continue to wean per protocol.  Continue supportive measures 2. ALS advanced disease now is not able to tolerate any weaning 3. Aspiration pneumonia treated improving 4. ARDS improving   I have personally seen and evaluated the patient, evaluated laboratory and imaging results, formulated the assessment and plan and placed orders. The Patient requires high complexity decision making for assessment and support.  Case was discussed on Rounds with the Respiratory Therapy Staff  Allyne Gee, MD [facility] Pulmonary Critical Care Medicine Sleep Medicine

## 2019-10-01 ENCOUNTER — Emergency Department (HOSPITAL_COMMUNITY): Payer: Medicare Other

## 2019-10-01 ENCOUNTER — Inpatient Hospital Stay (HOSPITAL_COMMUNITY)
Admission: EM | Admit: 2019-10-01 | Discharge: 2019-10-12 | DRG: 870 | Disposition: A | Discharge status: Other Acute Inpatient Hospital | Payer: Medicare Other | Source: Skilled Nursing Facility | Attending: Family Medicine | Admitting: Family Medicine

## 2019-10-01 ENCOUNTER — Other Ambulatory Visit: Payer: Self-pay

## 2019-10-01 ENCOUNTER — Encounter (HOSPITAL_COMMUNITY): Payer: Self-pay | Admitting: Emergency Medicine

## 2019-10-01 DIAGNOSIS — N179 Acute kidney failure, unspecified: Secondary | ICD-10-CM | POA: Diagnosis present

## 2019-10-01 DIAGNOSIS — E441 Mild protein-calorie malnutrition: Secondary | ICD-10-CM | POA: Diagnosis present

## 2019-10-01 DIAGNOSIS — J15211 Pneumonia due to Methicillin susceptible Staphylococcus aureus: Secondary | ICD-10-CM | POA: Diagnosis not present

## 2019-10-01 DIAGNOSIS — G1221 Amyotrophic lateral sclerosis: Secondary | ICD-10-CM | POA: Diagnosis not present

## 2019-10-01 DIAGNOSIS — Z79899 Other long term (current) drug therapy: Secondary | ICD-10-CM

## 2019-10-01 DIAGNOSIS — Z931 Gastrostomy status: Secondary | ICD-10-CM | POA: Diagnosis not present

## 2019-10-01 DIAGNOSIS — R6521 Severe sepsis with septic shock: Secondary | ICD-10-CM | POA: Diagnosis not present

## 2019-10-01 DIAGNOSIS — L8990 Pressure ulcer of unspecified site, unspecified stage: Secondary | ICD-10-CM | POA: Insufficient documentation

## 2019-10-01 DIAGNOSIS — J95851 Ventilator associated pneumonia: Secondary | ICD-10-CM | POA: Diagnosis not present

## 2019-10-01 DIAGNOSIS — L89153 Pressure ulcer of sacral region, stage 3: Secondary | ICD-10-CM | POA: Diagnosis not present

## 2019-10-01 DIAGNOSIS — Z7189 Other specified counseling: Secondary | ICD-10-CM

## 2019-10-01 DIAGNOSIS — Z930 Tracheostomy status: Secondary | ICD-10-CM | POA: Diagnosis not present

## 2019-10-01 DIAGNOSIS — Z7952 Long term (current) use of systemic steroids: Secondary | ICD-10-CM

## 2019-10-01 DIAGNOSIS — R64 Cachexia: Secondary | ICD-10-CM | POA: Diagnosis present

## 2019-10-01 DIAGNOSIS — K219 Gastro-esophageal reflux disease without esophagitis: Secondary | ICD-10-CM | POA: Diagnosis present

## 2019-10-01 DIAGNOSIS — A4101 Sepsis due to Methicillin susceptible Staphylococcus aureus: Secondary | ICD-10-CM | POA: Diagnosis not present

## 2019-10-01 DIAGNOSIS — I10 Essential (primary) hypertension: Secondary | ICD-10-CM | POA: Diagnosis present

## 2019-10-01 DIAGNOSIS — Z9911 Dependence on respirator [ventilator] status: Secondary | ICD-10-CM | POA: Diagnosis not present

## 2019-10-01 DIAGNOSIS — Z9289 Personal history of other medical treatment: Secondary | ICD-10-CM

## 2019-10-01 DIAGNOSIS — R0989 Other specified symptoms and signs involving the circulatory and respiratory systems: Secondary | ICD-10-CM

## 2019-10-01 DIAGNOSIS — Z978 Presence of other specified devices: Secondary | ICD-10-CM

## 2019-10-01 DIAGNOSIS — E876 Hypokalemia: Secondary | ICD-10-CM | POA: Diagnosis not present

## 2019-10-01 DIAGNOSIS — E119 Type 2 diabetes mellitus without complications: Secondary | ICD-10-CM | POA: Diagnosis not present

## 2019-10-01 DIAGNOSIS — K5900 Constipation, unspecified: Secondary | ICD-10-CM | POA: Diagnosis not present

## 2019-10-01 DIAGNOSIS — Z515 Encounter for palliative care: Secondary | ICD-10-CM

## 2019-10-01 DIAGNOSIS — R0602 Shortness of breath: Secondary | ICD-10-CM | POA: Diagnosis present

## 2019-10-01 DIAGNOSIS — R69 Illness, unspecified: Secondary | ICD-10-CM

## 2019-10-01 DIAGNOSIS — M21371 Foot drop, right foot: Secondary | ICD-10-CM | POA: Diagnosis not present

## 2019-10-01 DIAGNOSIS — A4151 Sepsis due to Escherichia coli [E. coli]: Secondary | ICD-10-CM | POA: Diagnosis not present

## 2019-10-01 DIAGNOSIS — J9621 Acute and chronic respiratory failure with hypoxia: Secondary | ICD-10-CM | POA: Diagnosis present

## 2019-10-01 DIAGNOSIS — Z885 Allergy status to narcotic agent status: Secondary | ICD-10-CM

## 2019-10-01 DIAGNOSIS — R0902 Hypoxemia: Secondary | ICD-10-CM

## 2019-10-01 DIAGNOSIS — Y95 Nosocomial condition: Secondary | ICD-10-CM | POA: Diagnosis present

## 2019-10-01 DIAGNOSIS — Z20828 Contact with and (suspected) exposure to other viral communicable diseases: Secondary | ICD-10-CM | POA: Diagnosis not present

## 2019-10-01 DIAGNOSIS — F329 Major depressive disorder, single episode, unspecified: Secondary | ICD-10-CM | POA: Diagnosis present

## 2019-10-01 DIAGNOSIS — F419 Anxiety disorder, unspecified: Secondary | ICD-10-CM | POA: Diagnosis present

## 2019-10-01 DIAGNOSIS — F411 Generalized anxiety disorder: Secondary | ICD-10-CM

## 2019-10-01 DIAGNOSIS — I493 Ventricular premature depolarization: Secondary | ICD-10-CM | POA: Diagnosis not present

## 2019-10-01 DIAGNOSIS — Z7901 Long term (current) use of anticoagulants: Secondary | ICD-10-CM

## 2019-10-01 DIAGNOSIS — J189 Pneumonia, unspecified organism: Secondary | ICD-10-CM

## 2019-10-01 DIAGNOSIS — Z794 Long term (current) use of insulin: Secondary | ICD-10-CM

## 2019-10-01 LAB — POCT I-STAT 7, (LYTES, BLD GAS, ICA,H+H)
Acid-Base Excess: 11 mmol/L — ABNORMAL HIGH (ref 0.0–2.0)
Bicarbonate: 33.9 mmol/L — ABNORMAL HIGH (ref 20.0–28.0)
Calcium, Ion: 1.34 mmol/L (ref 1.15–1.40)
HCT: 34 % — ABNORMAL LOW (ref 36.0–46.0)
Hemoglobin: 11.6 g/dL — ABNORMAL LOW (ref 12.0–15.0)
O2 Saturation: 94 %
Patient temperature: 99.8
Potassium: 3.4 mmol/L — ABNORMAL LOW (ref 3.5–5.1)
Sodium: 136 mmol/L (ref 135–145)
TCO2: 35 mmol/L — ABNORMAL HIGH (ref 22–32)
pCO2 arterial: 36.8 mmHg (ref 32.0–48.0)
pH, Arterial: 7.573 — ABNORMAL HIGH (ref 7.350–7.450)
pO2, Arterial: 62 mmHg — ABNORMAL LOW (ref 83.0–108.0)

## 2019-10-01 LAB — COMPREHENSIVE METABOLIC PANEL
ALT: 19 U/L (ref 0–44)
AST: 16 U/L (ref 15–41)
Albumin: 2.3 g/dL — ABNORMAL LOW (ref 3.5–5.0)
Alkaline Phosphatase: 93 U/L (ref 38–126)
Anion gap: 13 (ref 5–15)
BUN: 31 mg/dL — ABNORMAL HIGH (ref 8–23)
CO2: 29 mmol/L (ref 22–32)
Calcium: 9.7 mg/dL (ref 8.9–10.3)
Chloride: 93 mmol/L — ABNORMAL LOW (ref 98–111)
Creatinine, Ser: 0.62 mg/dL (ref 0.44–1.00)
GFR calc Af Amer: >60 mL/min (ref >60)
GFR calc non Af Amer: >60 mL/min (ref >60)
Glucose, Bld: 223 mg/dL — ABNORMAL HIGH (ref 70–99)
Potassium: 3.3 mmol/L — ABNORMAL LOW (ref 3.5–5.1)
Sodium: 135 mmol/L (ref 135–145)
Total Bilirubin: 0.5 mg/dL (ref 0.3–1.2)
Total Protein: 6.3 g/dL — ABNORMAL LOW (ref 6.5–8.1)

## 2019-10-01 LAB — CBC WITH DIFFERENTIAL/PLATELET
Abs Immature Granulocytes: 0.11 10*3/uL — ABNORMAL HIGH (ref 0.00–0.07)
Basophils Absolute: 0 10*3/uL (ref 0.0–0.1)
Basophils Relative: 0 %
Eosinophils Absolute: 0 10*3/uL (ref 0.0–0.5)
Eosinophils Relative: 0 %
HCT: 32.2 % — ABNORMAL LOW (ref 36.0–46.0)
Hemoglobin: 10.1 g/dL — ABNORMAL LOW (ref 12.0–15.0)
Immature Granulocytes: 1 %
Lymphocytes Relative: 4 %
Lymphs Abs: 0.7 10*3/uL (ref 0.7–4.0)
MCH: 28.8 pg (ref 26.0–34.0)
MCHC: 31.4 g/dL (ref 30.0–36.0)
MCV: 91.7 fL (ref 80.0–100.0)
Monocytes Absolute: 0.8 10*3/uL (ref 0.1–1.0)
Monocytes Relative: 5 %
Neutro Abs: 15.2 10*3/uL — ABNORMAL HIGH (ref 1.7–7.7)
Neutrophils Relative %: 90 %
Platelets: 441 10*3/uL — ABNORMAL HIGH (ref 150–400)
RBC: 3.51 MIL/uL — ABNORMAL LOW (ref 3.87–5.11)
RDW: 13 % (ref 11.5–15.5)
WBC: 16.8 10*3/uL — ABNORMAL HIGH (ref 4.0–10.5)
nRBC: 0 % (ref 0.0–0.2)

## 2019-10-01 LAB — PROCALCITONIN: Procalcitonin: 0.29 ng/mL

## 2019-10-01 LAB — APTT: aPTT: 33 seconds (ref 24–36)

## 2019-10-01 LAB — SARS CORONAVIRUS 2 BY RT PCR (HOSPITAL ORDER, PERFORMED IN ~~LOC~~ HOSPITAL LAB): SARS Coronavirus 2: NEGATIVE

## 2019-10-01 LAB — PROTIME-INR
INR: 1.2 (ref 0.8–1.2)
Prothrombin Time: 15.2 seconds (ref 11.4–15.2)

## 2019-10-01 LAB — D-DIMER, QUANTITATIVE: D-Dimer, Quant: 0.6 ug/mL-FEU — ABNORMAL HIGH (ref 0.00–0.50)

## 2019-10-01 LAB — LIPASE, BLOOD: Lipase: 28 U/L (ref 11–51)

## 2019-10-01 LAB — LACTIC ACID, PLASMA
Lactic Acid, Venous: 2.7 mmol/L (ref 0.5–1.9)
Lactic Acid, Venous: 3.4 mmol/L (ref 0.5–1.9)

## 2019-10-01 MED ORDER — SODIUM CHLORIDE 0.9 % IV SOLN
2.0000 g | Freq: Two times a day (BID) | INTRAVENOUS | Status: DC
  Administered 2019-10-02 – 2019-10-03 (×3): 2 g via INTRAVENOUS
  Filled 2019-10-01 (×4): qty 2

## 2019-10-01 MED ORDER — VANCOMYCIN HCL IN DEXTROSE 1-5 GM/200ML-% IV SOLN
1000.0000 mg | Freq: Once | INTRAVENOUS | Status: AC
  Administered 2019-10-01: 20:00:00 1000 mg via INTRAVENOUS
  Filled 2019-10-01: qty 200

## 2019-10-01 MED ORDER — SODIUM CHLORIDE 0.9 % IV BOLUS
1000.0000 mL | Freq: Once | INTRAVENOUS | Status: AC
  Administered 2019-10-01: 1000 mL via INTRAVENOUS

## 2019-10-01 MED ORDER — SODIUM CHLORIDE 0.9 % IV SOLN
2.0000 g | Freq: Once | INTRAVENOUS | Status: AC
  Administered 2019-10-01: 20:00:00 2 g via INTRAVENOUS
  Filled 2019-10-01: qty 2

## 2019-10-01 MED ORDER — VANCOMYCIN HCL 500 MG IV SOLR
500.0000 mg | Freq: Once | INTRAVENOUS | Status: AC
  Administered 2019-10-01: 21:00:00 500 mg via INTRAVENOUS
  Filled 2019-10-01: qty 500

## 2019-10-01 MED ORDER — SODIUM CHLORIDE 0.9 % IV BOLUS
1000.0000 mL | Freq: Once | INTRAVENOUS | Status: AC
  Administered 2019-10-01: 23:00:00 1000 mL via INTRAVENOUS

## 2019-10-01 MED ORDER — VANCOMYCIN HCL 10 G IV SOLR
1250.0000 mg | INTRAVENOUS | Status: DC
  Administered 2019-10-02: 23:00:00 1250 mg via INTRAVENOUS
  Filled 2019-10-01 (×3): qty 1250

## 2019-10-01 MED ORDER — METRONIDAZOLE IN NACL 5-0.79 MG/ML-% IV SOLN
500.0000 mg | Freq: Once | INTRAVENOUS | Status: AC
  Administered 2019-10-01: 500 mg via INTRAVENOUS
  Filled 2019-10-01: qty 100

## 2019-10-01 NOTE — Progress Notes (Signed)
Pharmacy Antibiotic Note  Emily Moody is a 68 y.o. female admitted on 10/01/2019 with sepsis.  Pharmacy has been consulted for Cefepime and Vancomycin dosing.  Height: 5\' 5"  (165.1 cm) Weight: 134 lb (60.8 kg) IBW/kg (Calculated) : 57  Temp (24hrs), Avg:99.4 F (37.4 C), Min:99 F (37.2 C), Max:99.8 F (37.7 C)  Recent Labs  Lab 10/01/19 1850  WBC 16.8*  CREATININE 0.62  LATICACIDVEN 2.7*    Estimated Creatinine Clearance: 61.4 mL/min (by C-G formula based on SCr of 0.62 mg/dL).    No Known Allergies  Antimicrobials this admission: 10/27 Cefepime  >>  10/27 Flagyl >>  10/27 Vancomycin >>  Microbiology results: 10/2 BCx: Pending   Plans:  - Will dose cefepime 2 grams IV q12h - Patient already started on 1g vancomycin ordered by ED provider - Will order and additional 500 mg of Vancomycin IV to follow the Vancomycin 1000mg  dose.  - Will then continue Vancomycin 1250 mg IV q24hr for an est AUC of 515.  - Will continue to monitor patients renal function   Thank you for allowing pharmacy to be a part of this patient's care.  Duanne Limerick PharmD. BCPS  10/01/2019 8:13 PM

## 2019-10-01 NOTE — ED Provider Notes (Addendum)
[facility] Provider Note   CSN: [id number] Arrival date & time: 10/01/19  1817     History   Chief Complaint Chief Complaint  Patient presents with  . Shortness of Breath    HPI Lynne LeaderJoy A Hart RochesterLawson is a 68 y.o. female.     HPI Patient sent from [facility] nursing facility with increasing shortness of breath and chest x-ray showing worsening infiltrate.  Patient has been treated for aspiration pneumonia.  Today they were unable to get her oxygen saturations up despite increasing FiO2 and inspiratory pressures.  There is no report of documented fever.  [facility] reports on their arrival patient has systolic blood pressures in the 60s.  She was given a 250 cc normal saline bolus prior to arrival with slight temporary improvement.  Reportedly, per EMS patient has not had fluid resuscitation at [facility].  She was getting feedings through her feeding tube.  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  The patient is awake.  She indicates pain and discomfort around her buttocks.  She does have a pressure wound present.  It has been examined.  She does not communicate verbally due to her tracheostomy.  She is however awake and nodding in response to questions. Past Medical History:  Diagnosis Date  . Acute on chronic respiratory failure with hypoxia (HCC)   . ALS (amyotrophic lateral sclerosis) (HCC)   . ARDS (adult respiratory distress syndrome) (HCC)   . Aspiration pneumonia due to gastric secretions [facility](HCC)     Patient Active Problem List   Diagnosis Date Noted  . Acute on chronic respiratory failure with hypoxia (HCC)   . ALS (amyotrophic lateral sclerosis) (HCC)   . Aspiration pneumonia due to gastric secretions (HCC)   . ARDS (adult respiratory distress syndrome) (HCC)   . FATIGUE 04/11/2007  . DISORDER, DEPRESSIVE NEC 12/13/2006  . HYPERTENSION, BENIGN 12/13/2006  . GERD 12/13/2006   . POSTMENOPAUSAL STATUS 12/13/2006  . SYMPTOM, EDEMA 12/13/2006    History reviewed. No pertinent surgical history.   OB History   No obstetric history on file.      Home Medications    Prior to Admission medications   Medication Sig Start Date End Date Taking? Authorizing Provider  Amino Acids-Protein Hydrolys (PRO-STAT AWC) LIQD Place 30 mLs into feeding tube 3 (three) times daily.   Yes [provider]  amLODipine (NORVASC) 10 MG tablet Take 10 mg by mouth daily. 07/01/19  Yes [provider]  busPIRone (BUSPAR) 5 MG tablet Give 5 mg by tube 2 (two) times daily. 03/14/19 08/22/20 Yes [provider]  chlorhexidine (PERIDEX) 0.12 % solution Use as directed 5 mLs in the mouth or throat 2 (two) times daily. Start of shift   Yes [provider]  clonazePAM (KLONOPIN) 1 MG tablet Place 1 mg into feeding tube 3 (three) times daily.   Yes [provider]  enoxaparin (LOVENOX) 40 MG/0.4ML injection Inject 40 mg into the skin daily.   Yes [provider]  furosemide (LASIX) 10 MG/ML injection Inject 40 mg into the vein 2 (two) times daily.   Yes [provider]  hydrocortisone sodium succinate (SOLU-CORTEF) 100 MG SOLR injection Inject 100 mg into the vein daily.   Yes [provider]  insulin aspart (NOVOLOG FLEXPEN) 100 UNIT/ML FlexPen Inject 0-10 Units into the skin every 6 (six) hours. 0-150 = 0 units 151-200 = 2 units 201-250 = 4 units 251-300 =  6 units 301-350 = 8 units 351-400 = 10 units >400 notify MD   Yes [provider]  metoprolol tartrate (LOPRESSOR) 25 MG tablet Place 25 mg into feeding tube 2 (two) times daily. 05/27/19  Yes [provider]  Multiple Vitamins-Minerals (MULTIVITAMIN ADULT) TABS Place 1 tablet into feeding tube daily.   Yes [provider]  riluzole (RILUTEK) 50 MG tablet Place 50 mg into feeding tube every 12 (twelve) hours.   Yes [provider]   sertraline (ZOLOFT) 50 MG tablet Give 150 mg by tube daily. 08/24/19  Yes [provider]  tamsulosin (FLOMAX) 0.4 MG CAPS capsule Take 0.4 mg by mouth daily. Per tube   Yes [provider]    Family History History reviewed. No pertinent family history.  Social History Social History   Tobacco Use  . Smoking status: Not on file  Substance Use Topics  . Alcohol use: Not on file  . Drug use: Not on file     Allergies   Morphine   Review of Systems Review of Systems Level 5 caveat cannot obtain review of systems due to patient condition.  Physical Exam Updated Vital Signs BP 123/83   Pulse 77   Temp 99.8 F (37.7 C) (Rectal)   Resp (!) 21   Ht 5\' 5"  (1.651 m)   Wt 60.8 kg   SpO2 100%   BMI 22.30 kg/m   Physical Exam Constitutional:      Comments: Patient is awake and alert.  She has good eye contact.  She is currently on the ventilator via tracheostomy.  She does not have appearance of respiratory distress.  She is not anxious or agitated.  HENT:     Head: Normocephalic and atraumatic.     Mouth/Throat:     Mouth: Mucous membranes are dry.  Eyes:     Extraocular Movements: Extraocular movements intact.  Neck:     Comments: Tracheostomy in place.  Currently on the ventilator. Cardiovascular:     Rate and Rhythm: Normal rate and regular rhythm.  Pulmonary:     Comments: Patient is getting inspirations by the ventilator.  Decreased ventilation at the bases.  Some expiratory wheeze.  Coarse in nature.  No significant rhonchi. Abdominal:     General: There is no distension.     Palpations: Abdomen is soft. There is no mass.     Tenderness: There is no abdominal tenderness. There is no guarding.  Genitourinary:    Comments: Rectum has a large amount of medium soft stool in it.  Stool is brown.  No melena.  No firm impaction quality.  See attached images for sacral decubitus. Musculoskeletal:     Comments: Extremities are thin.  Lower legs do not  have any edema.  They are symmetric in appearance.  Patient's right foot is fairly cool to the touch but has normal pulse.  Left foot is warm with normal pulse.  Pulses are symmetric between the 2 feet.  No mottling or appearance of embolic issues.  Skin:    General: Skin is warm and dry.     Coloration: Skin is pale.  Neurological:     Comments: Patient is awake.  She does nod her head in response to questions.  She has good eye contact.  She does not interact verbally.  Patient is very generally weak with history of ALS.  Psychiatric:        Mood and Affect: Mood normal.     Comments: Patient is  calm.  She does not seem agitated or anxious.        ED Treatments / Results  Labs (all labs ordered are listed, but only abnormal results are displayed) Labs Reviewed  LACTIC ACID, PLASMA - Abnormal; Notable for the following components:      Result Value   Lactic Acid, Venous 2.7 (*)    All other components within normal limits  LACTIC ACID, PLASMA - Abnormal; Notable for the following components:   Lactic Acid, Venous 3.4 (*)    All other components within normal limits  COMPREHENSIVE METABOLIC PANEL - Abnormal; Notable for the following components:   Potassium 3.3 (*)    Chloride 93 (*)    Glucose, Bld 223 (*)    BUN 31 (*)    Total Protein 6.3 (*)    Albumin 2.3 (*)    All other components within normal limits  CBC WITH DIFFERENTIAL/PLATELET - Abnormal; Notable for the following components:   WBC 16.8 (*)    RBC 3.51 (*)    Hemoglobin 10.1 (*)    HCT 32.2 (*)    Platelets 441 (*)    Neutro Abs 15.2 (*)    Abs Immature Granulocytes 0.11 (*)    All other components within normal limits  D-DIMER, QUANTITATIVE (NOT AT [facility]) - Abnormal; Notable for the following components:   D-Dimer, Quant 0.60 (*)    All other components within normal limits  POCT I-STAT 7, (LYTES, BLD GAS, ICA,H+H) - Abnormal; Notable for the following components:   pH, Arterial 7.573 (*)    pO2, Arterial  62.0 (*)    Bicarbonate 33.9 (*)    TCO2 35 (*)    Acid-Base Excess 11.0 (*)    Potassium 3.4 (*)    HCT 34.0 (*)    Hemoglobin 11.6 (*)    All other components within normal limits  SARS CORONAVIRUS 2 BY RT PCR (HOSPITAL ORDER, PERFORMED IN [facility])  CULTURE, BLOOD (ROUTINE X 2)  CULTURE, BLOOD (ROUTINE X 2)  URINE CULTURE  APTT  PROTIME-INR  PROCALCITONIN  LIPASE, BLOOD  URINALYSIS, ROUTINE W REFLEX MICROSCOPIC  I-STAT ARTERIAL BLOOD GAS, ED    EKG EKG Interpretation  Date/Time:  Tuesday October 01 2019 18:29:21 EDT Ventricular Rate:  97 PR Interval:    QRS Duration: 92 QT Interval:  346 QTC Calculation: 440 R Axis:   -113 Text Interpretation: Sinus or ectopic atrial tachycardia Atrial premature complexes Short PR interval Consider left ventricular hypertrophy no STEMI. Confirmed by Arby Barrette [phone]) on 10/01/2019 10:49:24 PM   Radiology Dg Chest Port 1 View  Result Date: 10/01/2019 CLINICAL DATA:  Hypoxia EXAM: PORTABLE CHEST 1 VIEW COMPARISON:  September 20, 2019. FINDINGS: Tracheostomy catheter tip is 4.5 cm above the carina. No pneumothorax. There is airspace consolidation in the left lower lobe with small left pleural effusion. The lungs elsewhere are clear. Heart is mildly enlarged with pulmonary vascularity normal. No adenopathy. There is aortic atherosclerosis. Bones are osteoporotic. IMPRESSION: Tracheostomy as described without pneumothorax. Airspace consolidation consistent with pneumonia left lower lobe with small left pleural effusion. Lungs elsewhere clear. Stable cardiac prominence. No adenopathy. Aortic Atherosclerosis (ICD10-I70.0). Electronically Signed   By: Bretta Bang III M.D.   On: 10/01/2019 19:22    Procedures Procedures (including critical care time) CRITICAL CARE Performed by: Arby Barrette   Total critical care time: 45 minutes  Critical care time was exclusive of separately billable procedures and treating other  patients.  Critical care was necessary  to treat or prevent imminent or life-threatening deterioration.  Critical care was time spent personally by me on the following activities: development of treatment plan with patient and/or surrogate as well as nursing, discussions with consultants, evaluation of patient's response to treatment, examination of patient, obtaining history from patient or surrogate, ordering and performing treatments and interventions, ordering and review of laboratory studies, ordering and review of radiographic studies, pulse oximetry and re-evaluation of patient's condition. Medications Ordered in ED Medications  ceFEPIme (MAXIPIME) 2 g in sodium chloride 0.9 % 100 mL IVPB (has no administration in time range)  vancomycin (VANCOCIN) 1,250 mg in sodium chloride 0.9 % 250 mL IVPB (has no administration in time range)  ceFEPIme (MAXIPIME) 2 g in sodium chloride 0.9 % 100 mL IVPB (0 g Intravenous Stopped 10/01/19 2115)  metroNIDAZOLE (FLAGYL) IVPB 500 mg (0 mg Intravenous Stopped 10/01/19 2115)  vancomycin (VANCOCIN) IVPB 1000 mg/200 mL premix (0 mg Intravenous Stopped 10/01/19 2116)  sodium chloride 0.9 % bolus 1,000 mL (0 mLs Intravenous Stopped 10/01/19 2153)  vancomycin (VANCOCIN) 500 mg in sodium chloride 0.9 % 100 mL IVPB (0 mg Intravenous Stopped 10/01/19 2228)  sodium chloride 0.9 % bolus 1,000 mL (1,000 mLs Intravenous New Bag/Given 10/01/19 2248)     Initial Impression / Assessment and Plan / ED Course  I have reviewed the triage vital signs and the nursing notes.  Pertinent labs & imaging results that were available during my care of the patient were reviewed by me and considered in my medical decision making (see chart for details).  Clinical Course as of Sep 30 2358  Tue Oct 01, 2019  2358 Consult : Reviewed with healing physician.  She will notify team to evaluate the patient in the emergency department.   [MP]    Clinical Course User Index [MP] Arby Barrette, MD      Patient is sent from [facility] nursing care for increasing hypoxia with diagnosis of aspiration pneumonia and prior ARDS.  Patient was found to be significant hypotensive.  She is responding to fluid resuscitation but blood pressures remain soft.  Patient is alert.  She is currently on the ventilator with oxygen saturations in the low 90s.  Patient does not appear uncomfortable or agitated.  Antibiotics initiated in the emergency department and fluid resuscitation.  Plan for admission to intensivist service.  Final Clinical Impressions(s) / ED Diagnoses   Final diagnoses:  HCAP (healthcare-associated pneumonia)  Severe comorbid illness    ED Discharge Orders    None       Arby Barrette, MD 10/01/19 2251    Arby Barrette, MD 10/01/19 6606    Arby Barrette, MD 10/01/19 2359

## 2019-10-01 NOTE — ED Notes (Signed)
Bladder Scan 357 mL

## 2019-10-01 NOTE — ED Triage Notes (Signed)
Progressive SOB since yesterday. Chest XR showing infiltrate is much larger today than yesterday. Increased pressures and increased FiO2. Pressure in 60s on [facility] arrival. 250 NS bolus PTA. PICC R upper arm accessed. A/O x4

## 2019-10-01 NOTE — ED Notes (Signed)
Bladder scan 220 mL

## 2019-10-02 ENCOUNTER — Telehealth: Payer: Self-pay

## 2019-10-02 DIAGNOSIS — Z515 Encounter for palliative care: Secondary | ICD-10-CM | POA: Diagnosis not present

## 2019-10-02 DIAGNOSIS — E876 Hypokalemia: Secondary | ICD-10-CM | POA: Diagnosis present

## 2019-10-02 DIAGNOSIS — F329 Major depressive disorder, single episode, unspecified: Secondary | ICD-10-CM | POA: Diagnosis present

## 2019-10-02 DIAGNOSIS — M21371 Foot drop, right foot: Secondary | ICD-10-CM | POA: Diagnosis present

## 2019-10-02 DIAGNOSIS — G1221 Amyotrophic lateral sclerosis: Secondary | ICD-10-CM | POA: Diagnosis present

## 2019-10-02 DIAGNOSIS — I10 Essential (primary) hypertension: Secondary | ICD-10-CM | POA: Diagnosis present

## 2019-10-02 DIAGNOSIS — J189 Pneumonia, unspecified organism: Secondary | ICD-10-CM

## 2019-10-02 DIAGNOSIS — R0902 Hypoxemia: Secondary | ICD-10-CM | POA: Diagnosis not present

## 2019-10-02 DIAGNOSIS — R5381 Other malaise: Secondary | ICD-10-CM | POA: Diagnosis not present

## 2019-10-02 DIAGNOSIS — A4151 Sepsis due to Escherichia coli [E. coli]: Secondary | ICD-10-CM | POA: Diagnosis present

## 2019-10-02 DIAGNOSIS — N179 Acute kidney failure, unspecified: Secondary | ICD-10-CM | POA: Diagnosis present

## 2019-10-02 DIAGNOSIS — F411 Generalized anxiety disorder: Secondary | ICD-10-CM | POA: Diagnosis not present

## 2019-10-02 DIAGNOSIS — E441 Mild protein-calorie malnutrition: Secondary | ICD-10-CM | POA: Diagnosis present

## 2019-10-02 DIAGNOSIS — R64 Cachexia: Secondary | ICD-10-CM | POA: Diagnosis present

## 2019-10-02 DIAGNOSIS — K5900 Constipation, unspecified: Secondary | ICD-10-CM | POA: Diagnosis not present

## 2019-10-02 DIAGNOSIS — Z978 Presence of other specified devices: Secondary | ICD-10-CM | POA: Diagnosis not present

## 2019-10-02 DIAGNOSIS — Z930 Tracheostomy status: Secondary | ICD-10-CM | POA: Diagnosis not present

## 2019-10-02 DIAGNOSIS — Z931 Gastrostomy status: Secondary | ICD-10-CM | POA: Diagnosis not present

## 2019-10-02 DIAGNOSIS — J9621 Acute and chronic respiratory failure with hypoxia: Secondary | ICD-10-CM | POA: Diagnosis present

## 2019-10-02 DIAGNOSIS — K219 Gastro-esophageal reflux disease without esophagitis: Secondary | ICD-10-CM | POA: Diagnosis present

## 2019-10-02 DIAGNOSIS — E119 Type 2 diabetes mellitus without complications: Secondary | ICD-10-CM | POA: Diagnosis present

## 2019-10-02 DIAGNOSIS — J9622 Acute and chronic respiratory failure with hypercapnia: Secondary | ICD-10-CM | POA: Diagnosis not present

## 2019-10-02 DIAGNOSIS — Z7189 Other specified counseling: Secondary | ICD-10-CM | POA: Diagnosis not present

## 2019-10-02 DIAGNOSIS — J9811 Atelectasis: Secondary | ICD-10-CM | POA: Diagnosis not present

## 2019-10-02 DIAGNOSIS — L89153 Pressure ulcer of sacral region, stage 3: Secondary | ICD-10-CM | POA: Diagnosis present

## 2019-10-02 DIAGNOSIS — R0602 Shortness of breath: Secondary | ICD-10-CM | POA: Diagnosis present

## 2019-10-02 DIAGNOSIS — Z20828 Contact with and (suspected) exposure to other viral communicable diseases: Secondary | ICD-10-CM | POA: Diagnosis present

## 2019-10-02 DIAGNOSIS — A4101 Sepsis due to Methicillin susceptible Staphylococcus aureus: Secondary | ICD-10-CM | POA: Diagnosis present

## 2019-10-02 DIAGNOSIS — F419 Anxiety disorder, unspecified: Secondary | ICD-10-CM | POA: Diagnosis present

## 2019-10-02 DIAGNOSIS — J15211 Pneumonia due to Methicillin susceptible Staphylococcus aureus: Secondary | ICD-10-CM | POA: Diagnosis present

## 2019-10-02 DIAGNOSIS — R6521 Severe sepsis with septic shock: Secondary | ICD-10-CM | POA: Diagnosis present

## 2019-10-02 DIAGNOSIS — J9611 Chronic respiratory failure with hypoxia: Secondary | ICD-10-CM | POA: Diagnosis not present

## 2019-10-02 DIAGNOSIS — A419 Sepsis, unspecified organism: Secondary | ICD-10-CM | POA: Diagnosis not present

## 2019-10-02 DIAGNOSIS — I493 Ventricular premature depolarization: Secondary | ICD-10-CM | POA: Diagnosis not present

## 2019-10-02 DIAGNOSIS — Y95 Nosocomial condition: Secondary | ICD-10-CM | POA: Diagnosis present

## 2019-10-02 DIAGNOSIS — J95851 Ventilator associated pneumonia: Secondary | ICD-10-CM | POA: Diagnosis present

## 2019-10-02 DIAGNOSIS — Z9911 Dependence on respirator [ventilator] status: Secondary | ICD-10-CM | POA: Diagnosis not present

## 2019-10-02 LAB — URINALYSIS, ROUTINE W REFLEX MICROSCOPIC
Bilirubin Urine: NEGATIVE
Glucose, UA: NEGATIVE mg/dL
Hgb urine dipstick: NEGATIVE
Ketones, ur: NEGATIVE mg/dL
Leukocytes,Ua: NEGATIVE
Nitrite: NEGATIVE
Protein, ur: 30 mg/dL — AB
Specific Gravity, Urine: 1.019 (ref 1.005–1.030)
pH: 6 (ref 5.0–8.0)

## 2019-10-02 LAB — COMPREHENSIVE METABOLIC PANEL
ALT: 19 U/L (ref 0–44)
AST: 16 U/L (ref 15–41)
Albumin: 2.1 g/dL — ABNORMAL LOW (ref 3.5–5.0)
Alkaline Phosphatase: 75 U/L (ref 38–126)
Anion gap: 9 (ref 5–15)
BUN: 23 mg/dL (ref 8–23)
CO2: 25 mmol/L (ref 22–32)
Calcium: 9.1 mg/dL (ref 8.9–10.3)
Chloride: 105 mmol/L (ref 98–111)
Creatinine, Ser: 0.33 mg/dL — ABNORMAL LOW (ref 0.44–1.00)
GFR calc Af Amer: >60 mL/min (ref >60)
GFR calc non Af Amer: >60 mL/min (ref >60)
Glucose, Bld: 130 mg/dL — ABNORMAL HIGH (ref 70–99)
Potassium: 2.9 mmol/L — ABNORMAL LOW (ref 3.5–5.1)
Sodium: 139 mmol/L (ref 135–145)
Total Bilirubin: 0.4 mg/dL (ref 0.3–1.2)
Total Protein: 5 g/dL — ABNORMAL LOW (ref 6.5–8.1)

## 2019-10-02 LAB — CBC WITH DIFFERENTIAL/PLATELET
Abs Immature Granulocytes: 0.08 10*3/uL — ABNORMAL HIGH (ref 0.00–0.07)
Basophils Absolute: 0 10*3/uL (ref 0.0–0.1)
Basophils Relative: 0 %
Eosinophils Absolute: 0 10*3/uL (ref 0.0–0.5)
Eosinophils Relative: 0 %
HCT: 29.2 % — ABNORMAL LOW (ref 36.0–46.0)
Hemoglobin: 9.1 g/dL — ABNORMAL LOW (ref 12.0–15.0)
Immature Granulocytes: 1 %
Lymphocytes Relative: 10 %
Lymphs Abs: 1.2 10*3/uL (ref 0.7–4.0)
MCH: 29.1 pg (ref 26.0–34.0)
MCHC: 31.2 g/dL (ref 30.0–36.0)
MCV: 93.3 fL (ref 80.0–100.0)
Monocytes Absolute: 0.7 10*3/uL (ref 0.1–1.0)
Monocytes Relative: 6 %
Neutro Abs: 9.7 10*3/uL — ABNORMAL HIGH (ref 1.7–7.7)
Neutrophils Relative %: 83 %
Platelets: 366 10*3/uL (ref 150–400)
RBC: 3.13 MIL/uL — ABNORMAL LOW (ref 3.87–5.11)
RDW: 13.1 % (ref 11.5–15.5)
WBC: 11.6 10*3/uL — ABNORMAL HIGH (ref 4.0–10.5)
nRBC: 0 % (ref 0.0–0.2)

## 2019-10-02 LAB — GLUCOSE, CAPILLARY
Glucose-Capillary: 111 mg/dL — ABNORMAL HIGH (ref 70–99)
Glucose-Capillary: 92 mg/dL (ref 70–99)

## 2019-10-02 LAB — LACTIC ACID, PLASMA
Lactic Acid, Venous: 1 mmol/L (ref 0.5–1.9)
Lactic Acid, Venous: 0.8 mmol/L (ref 0.5–1.9)

## 2019-10-02 LAB — HIV ANTIBODY (ROUTINE TESTING W REFLEX): HIV Screen 4th Generation wRfx: NONREACTIVE

## 2019-10-02 LAB — STREP PNEUMONIAE URINARY ANTIGEN: Strep Pneumo Urinary Antigen: NEGATIVE

## 2019-10-02 LAB — CBG MONITORING, ED
Glucose-Capillary: 130 mg/dL — ABNORMAL HIGH (ref 70–99)
Glucose-Capillary: 98 mg/dL (ref 70–99)
Glucose-Capillary: 117 mg/dL — ABNORMAL HIGH (ref 70–99)
Glucose-Capillary: 123 mg/dL — ABNORMAL HIGH (ref 70–99)

## 2019-10-02 LAB — POTASSIUM: Potassium: 4.1 mmol/L (ref 3.5–5.1)

## 2019-10-02 LAB — MRSA PCR SCREENING: MRSA by PCR: NEGATIVE

## 2019-10-02 LAB — URINE CULTURE: Culture: NO GROWTH

## 2019-10-02 LAB — HEMOGLOBIN A1C
Hgb A1c MFr Bld: 6.1 % — ABNORMAL HIGH (ref 4.8–5.6)
Mean Plasma Glucose: 128.37 mg/dL

## 2019-10-02 MED ORDER — SERTRALINE HCL 50 MG PO TABS
150.0000 mg | ORAL_TABLET | Freq: Every day | ORAL | Status: DC
  Administered 2019-10-02 – 2019-10-12 (×10): 150 mg
  Filled 2019-10-02 (×17): qty 1

## 2019-10-02 MED ORDER — ENOXAPARIN SODIUM 40 MG/0.4ML ~~LOC~~ SOLN
40.0000 mg | Freq: Every day | SUBCUTANEOUS | Status: DC
  Administered 2019-10-02 – 2019-10-12 (×11): 40 mg via SUBCUTANEOUS
  Filled 2019-10-02 (×11): qty 0.4

## 2019-10-02 MED ORDER — CLONAZEPAM 1 MG PO TABS
1.0000 mg | ORAL_TABLET | Freq: Three times a day (TID) | ORAL | Status: DC
  Administered 2019-10-02 – 2019-10-09 (×21): 1 mg
  Filled 2019-10-02 (×17): qty 1
  Filled 2019-10-02: qty 2
  Filled 2019-10-02 (×3): qty 1

## 2019-10-02 MED ORDER — METRONIDAZOLE IN NACL 5-0.79 MG/ML-% IV SOLN
500.0000 mg | Freq: Three times a day (TID) | INTRAVENOUS | Status: DC
  Administered 2019-10-02 – 2019-10-03 (×4): 500 mg via INTRAVENOUS
  Filled 2019-10-02 (×4): qty 100

## 2019-10-02 MED ORDER — NOREPINEPHRINE 4 MG/250ML-% IV SOLN
0.0000 ug/min | INTRAVENOUS | Status: DC
  Administered 2019-10-02: 01:00:00 2 ug/min via INTRAVENOUS
  Filled 2019-10-02: qty 250

## 2019-10-02 MED ORDER — CLONAZEPAM 0.5 MG PO TABS: 1.0000 mg | ORAL_TABLET | Freq: Three times a day (TID) | ORAL | Status: DC

## 2019-10-02 MED ORDER — PANTOPRAZOLE SODIUM 40 MG IV SOLR
40.0000 mg | Freq: Every day | INTRAVENOUS | Status: DC
  Administered 2019-10-02 – 2019-10-04 (×3): 40 mg via INTRAVENOUS
  Filled 2019-10-02 (×4): qty 40

## 2019-10-02 MED ORDER — ORAL CARE MOUTH RINSE
15.0000 mL | OROMUCOSAL | Status: DC
  Administered 2019-10-02 – 2019-10-12 (×82): 15 mL via OROMUCOSAL

## 2019-10-02 MED ORDER — INSULIN ASPART 100 UNIT/ML ~~LOC~~ SOLN
0.0000 [IU] | SUBCUTANEOUS | Status: DC
  Administered 2019-10-02: 05:00:00 1 [IU] via SUBCUTANEOUS
  Administered 2019-10-03 (×2): 2 [IU] via SUBCUTANEOUS
  Administered 2019-10-04 (×2): 1 [IU] via SUBCUTANEOUS
  Administered 2019-10-04 – 2019-10-05 (×4): 2 [IU] via SUBCUTANEOUS
  Administered 2019-10-05 – 2019-10-06 (×5): 1 [IU] via SUBCUTANEOUS
  Administered 2019-10-06: 2 [IU] via SUBCUTANEOUS
  Administered 2019-10-07 (×2): 1 [IU] via SUBCUTANEOUS
  Administered 2019-10-07 (×2): 2 [IU] via SUBCUTANEOUS
  Administered 2019-10-07: 1 [IU] via SUBCUTANEOUS
  Administered 2019-10-08 (×2): 2 [IU] via SUBCUTANEOUS
  Administered 2019-10-08: 1 [IU] via SUBCUTANEOUS
  Administered 2019-10-09 (×2): 2 [IU] via SUBCUTANEOUS
  Administered 2019-10-10: 1 [IU] via SUBCUTANEOUS
  Administered 2019-10-10: 3 [IU] via SUBCUTANEOUS
  Administered 2019-10-10 – 2019-10-11 (×2): 1 [IU] via SUBCUTANEOUS
  Administered 2019-10-11: 2 [IU] via SUBCUTANEOUS
  Administered 2019-10-11: 1 [IU] via SUBCUTANEOUS

## 2019-10-02 MED ORDER — BUSPIRONE HCL 5 MG PO TABS
5.0000 mg | ORAL_TABLET | Freq: Two times a day (BID) | ORAL | Status: DC
  Administered 2019-10-02 – 2019-10-12 (×21): 5 mg
  Filled 2019-10-02 (×21): qty 1

## 2019-10-02 MED ORDER — SODIUM CHLORIDE 0.9 % IV SOLN: Freq: Once | INTRAVENOUS | Status: DC

## 2019-10-02 MED ORDER — CHLORHEXIDINE GLUCONATE 0.12% ORAL RINSE (MEDLINE KIT)
15.0000 mL | Freq: Two times a day (BID) | OROMUCOSAL | Status: DC
  Administered 2019-10-02 – 2019-10-12 (×17): 15 mL via OROMUCOSAL

## 2019-10-02 MED ORDER — GUAIFENESIN 100 MG/5ML PO SOLN
15.0000 mL | Freq: Four times a day (QID) | ORAL | Status: AC
  Administered 2019-10-02 – 2019-10-04 (×7): 300 mg
  Filled 2019-10-02 (×10): qty 15

## 2019-10-02 MED ORDER — BUSPIRONE HCL 10 MG PO TABS: 5.0000 mg | ORAL_TABLET | Freq: Two times a day (BID) | ORAL | Status: DC

## 2019-10-02 MED ORDER — ALBUTEROL SULFATE (2.5 MG/3ML) 0.083% IN NEBU
2.5000 mg | INHALATION_SOLUTION | RESPIRATORY_TRACT | Status: DC
  Administered 2019-10-02 – 2019-10-07 (×27): 2.5 mg via RESPIRATORY_TRACT
  Filled 2019-10-02 (×27): qty 3

## 2019-10-02 MED ORDER — RILUZOLE 50 MG PO TABS
50.0000 mg | ORAL_TABLET | Freq: Two times a day (BID) | ORAL | Status: DC
  Administered 2019-10-04 – 2019-10-11 (×14): 50 mg
  Filled 2019-10-02 (×17): qty 1

## 2019-10-02 MED ORDER — POTASSIUM CHLORIDE IN NACL 40-0.9 MEQ/L-% IV SOLN
INTRAVENOUS | Status: DC
  Administered 2019-10-02 (×2): 100 mL/h via INTRAVENOUS
  Administered 2019-10-03: 17:00:00 50 mL/h via INTRAVENOUS
  Administered 2019-10-03: 100 mL/h via INTRAVENOUS
  Administered 2019-10-04 – 2019-10-06 (×3): 50 mL/h via INTRAVENOUS
  Filled 2019-10-02 (×9): qty 1000

## 2019-10-02 MED ORDER — ACETAMINOPHEN 325 MG PO TABS
650.0000 mg | ORAL_TABLET | ORAL | Status: DC | PRN
  Administered 2019-10-03: 650 mg
  Filled 2019-10-02: qty 2

## 2019-10-02 MED ORDER — CHLORHEXIDINE GLUCONATE CLOTH 2 % EX PADS
6.0000 | MEDICATED_PAD | Freq: Every day | CUTANEOUS | Status: DC
  Administered 2019-10-02 – 2019-10-11 (×10): 6 via TOPICAL

## 2019-10-02 MED ORDER — POTASSIUM CHLORIDE 20 MEQ/15ML (10%) PO SOLN
40.0000 meq | Freq: Three times a day (TID) | ORAL | Status: DC
  Administered 2019-10-02: 40 meq
  Filled 2019-10-02: qty 30

## 2019-10-02 MED ORDER — SERTRALINE HCL 50 MG PO TABS: 150.0000 mg | ORAL_TABLET | Freq: Every day | ORAL | Status: DC

## 2019-10-02 NOTE — ED Notes (Signed)
New orders received to stop levophed, restart if necessary. Will continue to monitor.

## 2019-10-02 NOTE — ED Notes (Signed)
Report attempted to floor, advised unable to take report at this time.

## 2019-10-02 NOTE — Progress Notes (Addendum)
[facility] Physician-Brief Progress Note Patient Name: Emily Moody DOB: [birth year] MRN: [id number]   Date of Service  10/02/2019  HPI/Events of Note  68/F with chronic trache, being admitted for sepsis secondary to pneumonia. LLL infiltrate seen on CXR. Pt was briefly on levophed.   On video asseessment, BP 144/70, HR 82, RR 29, O2 sats 96%.   eICU Interventions  Sepsis  Pneumonia DM  Plan> Continue with empiric antibiotics while awaiting cultures. Insulin for glucose control.  Lovenox for DVT prophylaxis and PPI for GI prophylaxis.     Intervention Category Evaluation Type: New Patient Evaluation  Elsie Lincoln 10/02/2019, 8:43 PM   9:45 PM Notified by RN that patient desaturates into the low 80s, improves after suctioning significant secretions.   Plan> Start on albuterol nebs q4 and suction and lavage by RT q4hrs for pulmonary toilet.

## 2019-10-02 NOTE — Sepsis Progress Note (Signed)
Notified bedside nurse of need to administer fluid bolus.  

## 2019-10-02 NOTE — Telephone Encounter (Signed)
Received skype stating Nurse Lexine Baton from ED needs to speak with Dr. Patsey Berthold. After speaking with ED secretary we were made aware she was looking for Dr. Marchelle Gearing, this patient is being admitted and they were trying to reach the on-call critical care. I made her aware this is the office of Vernard Gambles so they will need to contact whoever is no call. Voiced understanding. Nothing further is needed at this time.

## 2019-10-02 NOTE — Consult Note (Addendum)
NAME:  Emily Moody, MRN:  [id number], DOB:  1951/10/01, LOS: 0 ADMISSION DATE:  10/01/2019, CONSULTATION DATE:  10/28 REFERRING MD:  Donnald Garre, CHIEF COMPLAINT:  dyspnea  Brief History   68 year old woman with ALS, HTN, GERD, depression, here with worsening dyspnea, hypotension, LLL pna.    History of present illness   IN ED tonight for increased SOB, increasing pulmonary infiltrate on CXR.  Was requiring higher than usual fio2 (60%) and higher peak pressures.  Per daughter she was confused today as well.   Increased pressures, and increased FIO2 needs. Hypotensive to 60s on arrival of [facility].   Started cefepime, flagyl, vanc  Recent note from Pulmonology NP 09/22/19 at [facility]: on full vent support, FIO2 40% Rate 18, TV 400 PEEP 5.  They had not been successful with weaning. Had been treated for aspiration PNA, ARDS, improved.  Was discharged to [facility].   Chart review says hydrocortisone 100mg  Daily, but not on daughter's med list.    Daughter is at RT at [facility].   Past Medical History  ALS HTN GERD Depression Decubitus ulcer  Significant Hospital Events   ED: 3.4 L NS   Consults:    Procedures:    Significant Diagnostic Tests:  CXR:   Micro Data:  Blood cultures 10/27 Sputum cultures 10/28 (ordered)  Antimicrobials:  10/27 -  Cefepime 10/27 - Vanc 10/27 - flagyl  Interim history/subjective:    Objective   Blood pressure (!) 72/52, pulse 76, temperature 99.8 F (37.7 C), temperature source Rectal, resp. rate 14, height 5\' 5"  (1.651 m), weight 60.8 kg, SpO2 96 %.    Vent Mode: PCV FiO2 (%):  [50 %-60 %] 50 % Set Rate:  [14 bmp-18 bmp] 14 bmp PEEP:  [8 cmH20] 8 cmH20 Plateau Pressure:  [21 cmH20-25 cmH20] 21 cmH20   Intake/Output Summary (Last 24 hours) at 10/02/2019 0016 Last data filed at 10/02/2019 0008 Gross per 24 hour  Intake 3400 ml  Output -  Net 3400 ml   Filed Weights   10/01/19 1922  Weight: 60.8 kg    Examination: General:  NAD, trach site clean dry intact HENT: trach, on vent, perrl Lungs: decreased BS, crackles at L base.  Cardiovascular: RRR, frequent PACs Abdomen: NT, ND, NBS Extremities: no edema no tenderness Neuro: awake alert, yes to if she is in hospital, asking her daughter appropriate questions  Resolved Hospital Problem list     Assessment & Plan:  Hypoxemic respiratory failure:  LLL pna.  Cont broad antibiotics.   Baseline HCO3 29-30 Sputum and bl cx pending.   Consider bronch if LLL does not open up more.    ALS: s/p tracheostomy, now chronically ventilated 24/7.     Hypotension:  Lactate 3.4 (9pm), 2.7 (650) Procal 0.29 WBC 16.8 from 7.3 Blood cx pending Temp 100.1 here  Sepsis most likely.   BP meds held   Increased Cr (almost doubled from baseline) - monitor  Hypokalemia: add KCL to NS (100 cc/hr) x 1 l     Best practice:  Diet: npo, tube feeds Pain/Anxiety/Delirium protocol (if indicated):  VAP protocol (if indicated):  DVT prophylaxis: lovenox daily GI prophylaxis: protonix Glucose control: ISS Mobility: bedrest Code Status: Full Family Communication: Daughter at bedside.  Disposition: ICU  Labs   CBC: Recent Labs  Lab 10/01/19 1850 10/01/19 1928  WBC 16.8*  --   NEUTROABS 15.2*  --   HGB 10.1* 11.6*  HCT 32.2* 34.0*  MCV 91.7  --  PLT 441*  --     Basic Metabolic Panel: Recent Labs  Lab 10/01/19 1850 10/01/19 1928  NA 135 136  K 3.3* 3.4*  CL 93*  --   CO2 29  --   GLUCOSE 223*  --   BUN 31*  --   CREATININE 0.62  --   CALCIUM 9.7  --    GFR: Estimated Creatinine Clearance: 61.4 mL/min (by C-G formula based on SCr of 0.62 mg/dL). Recent Labs  Lab 10/01/19 1850 10/01/19 2104  PROCALCITON 0.29  --   WBC 16.8*  --   LATICACIDVEN 2.7* 3.4*    Liver Function Tests: Recent Labs  Lab 10/01/19 1850  AST 16  ALT 19  ALKPHOS 93  BILITOT 0.5  PROT 6.3*  ALBUMIN 2.3*   Recent Labs  Lab 10/01/19 1850  LIPASE 28   No results  for input(s): AMMONIA in the last 168 hours.  ABG    Component Value Date/Time   PHART 7.573 (H) 10/01/2019 1928   PCO2ART 36.8 10/01/2019 1928   PO2ART 62.0 (L) 10/01/2019 1928   HCO3 33.9 (H) 10/01/2019 1928   TCO2 35 (H) 10/01/2019 1928   O2SAT 94.0 10/01/2019 1928     Coagulation Profile: Recent Labs  Lab 10/01/19 1850  INR 1.2    Cardiac Enzymes: No results for input(s): CKTOTAL, CKMB, CKMBINDEX, TROPONINI in the last 168 hours.  HbA1C: No results found for: HGBA1C  CBG: No results for input(s): GLUCAP in the last 168 hours.  Review of Systems:   Unable to assess  Past Medical History  She,  has a past medical history of Acute on chronic respiratory failure with hypoxia ([place]), ALS (amyotrophic lateral sclerosis) ([place]), ARDS (adult respiratory distress syndrome) ([place]), and Aspiration pneumonia due to gastric secretions ([place]).   Surgical History   History reviewed. No pertinent surgical history.   Social History      Family History   Her family history is not on file.   Allergies Allergies  Allergen Reactions  . Morphine     Other reaction(s): Delusions (intolerance), Hallucinations, Mental Status Changes (intolerance)     Home Medications  Prior to Admission medications   Medication Sig Start Date End Date Taking? Authorizing Provider  Amino Acids-Protein Hydrolys (PRO-STAT AWC) LIQD Place 30 mLs into feeding tube 3 (three) times daily.   Yes [provider]  amLODipine (NORVASC) 10 MG tablet Take 10 mg by mouth daily. 07/01/19  Yes [provider]  busPIRone (BUSPAR) 5 MG tablet Give 5 mg by tube 2 (two) times daily. 03/14/19 08/22/20 Yes [provider]  chlorhexidine (PERIDEX) 0.12 % solution Use as directed 5 mLs in the mouth or throat 2 (two) times daily. Start of shift   Yes [provider]  clonazePAM (KLONOPIN) 1 MG tablet Place 1 mg into feeding tube 3 (three) times daily.   Yes [provider]   enoxaparin (LOVENOX) 40 MG/0.4ML injection Inject 40 mg into the skin daily.   Yes [provider]  furosemide (LASIX) 10 MG/ML injection Inject 40 mg into the vein 2 (two) times daily.   Yes [provider]  hydrocortisone sodium succinate (SOLU-CORTEF) 100 MG SOLR injection Inject 100 mg into the vein daily.   Yes [provider]  insulin aspart (NOVOLOG FLEXPEN) 100 UNIT/ML FlexPen Inject 0-10 Units into the skin every 6 (six) hours. 0-150 = 0 units 151-200 = 2 units 201-250 = 4 units 251-300 = 6 units 301-350 = 8 units 351-400 =  10 units >400 notify MD   Yes [provider]  metoprolol tartrate (LOPRESSOR) 25 MG tablet Place 25 mg into feeding tube 2 (two) times daily. 05/27/19  Yes [provider]  Multiple Vitamins-Minerals (MULTIVITAMIN ADULT) TABS Place 1 tablet into feeding tube daily.   Yes [provider]  riluzole (RILUTEK) 50 MG tablet Place 50 mg into feeding tube every 12 (twelve) hours.   Yes [provider]  sertraline (ZOLOFT) 50 MG tablet Give 150 mg by tube daily. 08/24/19  Yes [provider]  tamsulosin (FLOMAX) 0.4 MG CAPS capsule Take 0.4 mg by mouth daily. Per tube   Yes [provider]     Critical care time: 65 min

## 2019-10-02 NOTE — ED Notes (Signed)
Agarwala, MD notified patient having runs of vtach and junctional rhythm captured on cardiac monitor. No new orders at this time.

## 2019-10-02 NOTE — ED Notes (Addendum)
Pt turned with help of RRT and myself onto left side, skin remains clean, dry, intact.

## 2019-10-02 NOTE — ED Notes (Signed)
Per daughter, pt on continuous feeds at 45cc/hour with q2 flush. Daughter also states she can only tolerate limited amounts in the G, does perfectly fine with the J.

## 2019-10-02 NOTE — ED Notes (Signed)
1.5 cal osmolite

## 2019-10-02 NOTE — ED Notes (Signed)
Pt turned and respositioned on right side for comfort.

## 2019-10-02 NOTE — Progress Notes (Signed)
Brief progress note:  S/O: Pt remains in ED d/t no beds in house.  SR with PVCs and PACs on tele.  VSS. Off pressors.  Pt appears comfortable on vent.  K+ 2.9. Currently receiving NaCl with 32mEq/L @ 100cc/hr. Good UO.  Lactic acid 10/27 @ 2104 3.4, not repeated this am.   A/P: Hypokalemia KCl per tube 8mEq x3 doses.  Repeat K+ @ 1400   Continue to trend lactic acid.  Continue plan per Dr Duwayne Heck note this date.   Francine Graven, MSN, AGACNP  [facility]

## 2019-10-02 NOTE — ED Notes (Signed)
Updated patient's daughter by phone plan of care for today.

## 2019-10-03 ENCOUNTER — Inpatient Hospital Stay (HOSPITAL_COMMUNITY): Payer: Medicare Other

## 2019-10-03 DIAGNOSIS — J9621 Acute and chronic respiratory failure with hypoxia: Secondary | ICD-10-CM

## 2019-10-03 DIAGNOSIS — J9811 Atelectasis: Secondary | ICD-10-CM | POA: Diagnosis not present

## 2019-10-03 DIAGNOSIS — L8990 Pressure ulcer of unspecified site, unspecified stage: Secondary | ICD-10-CM | POA: Insufficient documentation

## 2019-10-03 DIAGNOSIS — J189 Pneumonia, unspecified organism: Secondary | ICD-10-CM | POA: Diagnosis not present

## 2019-10-03 LAB — BASIC METABOLIC PANEL
Anion gap: 7 (ref 5–15)
BUN: 13 mg/dL (ref 8–23)
CO2: 23 mmol/L (ref 22–32)
Calcium: 9.1 mg/dL (ref 8.9–10.3)
Chloride: 113 mmol/L — ABNORMAL HIGH (ref 98–111)
Creatinine, Ser: <0.3 mg/dL — ABNORMAL LOW (ref 0.44–1.00)
Glucose, Bld: 108 mg/dL — ABNORMAL HIGH (ref 70–99)
Potassium: 4.5 mmol/L (ref 3.5–5.1)
Sodium: 143 mmol/L (ref 135–145)

## 2019-10-03 LAB — POCT I-STAT 7, (LYTES, BLD GAS, ICA,H+H)
Bicarbonate: 24 mmol/L (ref 20.0–28.0)
Calcium, Ion: 1.37 mmol/L (ref 1.15–1.40)
HCT: 22 % — ABNORMAL LOW (ref 36.0–46.0)
Hemoglobin: 7.5 g/dL — ABNORMAL LOW (ref 12.0–15.0)
O2 Saturation: 98 %
Patient temperature: 37.1
Potassium: 4.5 mmol/L (ref 3.5–5.1)
Sodium: 143 mmol/L (ref 135–145)
TCO2: 25 mmol/L (ref 22–32)
pCO2 arterial: 37 mmHg (ref 32.0–48.0)
pH, Arterial: 7.421 (ref 7.350–7.450)
pO2, Arterial: 95 mmHg (ref 83.0–108.0)

## 2019-10-03 LAB — CBC
HCT: 25.9 % — ABNORMAL LOW (ref 36.0–46.0)
Hemoglobin: 8.1 g/dL — ABNORMAL LOW (ref 12.0–15.0)
MCH: 29 pg (ref 26.0–34.0)
MCHC: 31.3 g/dL (ref 30.0–36.0)
MCV: 92.8 fL (ref 80.0–100.0)
Platelets: 278 10*3/uL (ref 150–400)
RBC: 2.79 MIL/uL — ABNORMAL LOW (ref 3.87–5.11)
RDW: 13.2 % (ref 11.5–15.5)
WBC: 7.3 10*3/uL (ref 4.0–10.5)
nRBC: 0 % (ref 0.0–0.2)

## 2019-10-03 LAB — GLUCOSE, CAPILLARY
Glucose-Capillary: 94 mg/dL (ref 70–99)
Glucose-Capillary: 91 mg/dL (ref 70–99)
Glucose-Capillary: 108 mg/dL — ABNORMAL HIGH (ref 70–99)
Glucose-Capillary: 194 mg/dL — ABNORMAL HIGH (ref 70–99)
Glucose-Capillary: 168 mg/dL — ABNORMAL HIGH (ref 70–99)
Glucose-Capillary: 119 mg/dL — ABNORMAL HIGH (ref 70–99)

## 2019-10-03 LAB — LEGIONELLA PNEUMOPHILA SEROGP 1 UR AG: L. pneumophila Serogp 1 Ur Ag: NEGATIVE

## 2019-10-03 MED ORDER — SODIUM CHLORIDE 3 % IN NEBU
4.0000 mL | INHALATION_SOLUTION | Freq: Two times a day (BID) | RESPIRATORY_TRACT | Status: DC
  Administered 2019-10-03: 4 mL via RESPIRATORY_TRACT
  Filled 2019-10-03 (×2): qty 4

## 2019-10-03 MED ORDER — DILTIAZEM HCL-DEXTROSE 125-5 MG/125ML-% IV SOLN (PREMIX): 5.0000 mg/h | INTRAVENOUS | Status: DC

## 2019-10-03 MED ORDER — SODIUM CHLORIDE 0.9 % IV SOLN
2.0000 g | Freq: Three times a day (TID) | INTRAVENOUS | Status: DC
  Administered 2019-10-03 – 2019-10-04 (×3): 2 g via INTRAVENOUS
  Filled 2019-10-03 (×3): qty 2

## 2019-10-03 MED ORDER — MIDAZOLAM HCL 2 MG/2ML IJ SOLN
INTRAMUSCULAR | Status: AC
  Administered 2019-10-03: 2 mg
  Filled 2019-10-03: qty 2

## 2019-10-03 MED ORDER — DILTIAZEM LOAD VIA INFUSION: 10.0000 mg | Freq: Once | INTRAVENOUS | Status: DC

## 2019-10-03 MED ORDER — SODIUM CHLORIDE 3 % IN NEBU
4.0000 mL | INHALATION_SOLUTION | Freq: Two times a day (BID) | RESPIRATORY_TRACT | Status: DC
  Administered 2019-10-03 – 2019-10-07 (×8): 4 mL via RESPIRATORY_TRACT
  Filled 2019-10-03 (×8): qty 4

## 2019-10-03 MED ORDER — FENTANYL CITRATE (PF) 100 MCG/2ML IJ SOLN
INTRAMUSCULAR | Status: AC
  Administered 2019-10-03: 100 ug
  Filled 2019-10-03: qty 2

## 2019-10-03 MED ORDER — ADULT MULTIVITAMIN W/MINERALS CH
1.0000 | ORAL_TABLET | Freq: Every day | ORAL | Status: DC
  Administered 2019-10-03 – 2019-10-12 (×10): 1
  Filled 2019-10-03 (×10): qty 1

## 2019-10-03 MED ORDER — TRAMADOL HCL 50 MG PO TABS
25.0000 mg | ORAL_TABLET | Freq: Four times a day (QID) | ORAL | Status: DC | PRN
  Administered 2019-10-03 – 2019-10-12 (×23): 25 mg
  Filled 2019-10-03 (×23): qty 1

## 2019-10-03 MED ORDER — JEVITY 1.2 CAL PO LIQD
1000.0000 mL | ORAL | Status: DC
  Administered 2019-10-03 – 2019-10-06 (×4): 1000 mL
  Administered 2019-10-07: 50 mL/h
  Administered 2019-10-08 – 2019-10-10 (×3): 1000 mL
  Filled 2019-10-03 (×12): qty 1000

## 2019-10-03 MED ORDER — PRO-STAT SUGAR FREE PO LIQD
30.0000 mL | Freq: Two times a day (BID) | ORAL | Status: DC
  Administered 2019-10-03 – 2019-10-12 (×19): 30 mL
  Filled 2019-10-03 (×19): qty 30

## 2019-10-03 MED ORDER — PREDNISONE 5 MG/5ML PO SOLN
20.0000 mg | Freq: Every day | ORAL | Status: DC
  Administered 2019-10-03 – 2019-10-05 (×3): 20 mg
  Filled 2019-10-03 (×3): qty 20

## 2019-10-03 NOTE — Progress Notes (Signed)
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    History of present illness   Admitted from [facility] for increased SOB, increasing pulmonary infiltrate on CXR.  Was requiring higher than usual fio2 (60%) and higher peak pressures.   Increased pressures, and increased FIO2 needs. Hypotensive to 60s on arrival   Recent note from Pulmonology NP 09/22/19 at [facility]: on full vent support, FIO2 40% Rate 18, TV 400 PEEP 5.  They had not been successful with weaning. Had been treated for aspiration PNA, ARDS, improved.  Was discharged to [facility].   Chart review says hydrocortisone 123m Daily, but not on daughter's med list.    [facility]    Past Medical History  ALS HTN GERD Depression Decubitus ulcer  Significant Hospital Events      Consults:    Procedures:    Significant Diagnostic Tests:  CXR:   Micro Data:  Blood cultures 10/27 >> ng Sputum cultures 10/28 >> GPC pairs >> BAL 10/29 >>  Antimicrobials:  10/27 -  Cefepime >> 10/27 - Vanc >>> 10/29 10/27 - flagyl >> 10/29  Interim history/subjective:   Critically ill, chronic tracheostomy Blood pressure improved, off Levophed  Afebrile  Objective   Blood pressure 135/78, pulse 72, temperature 98.4 F (36.9 C), temperature source Bladder, resp. rate 20, height _0  (1.651 m), weight 60.1 kg, SpO2 100 %.    Vent Mode: PRVC FiO2 (%):  [50 %-60 %] 60 % Set Rate:  [14 bmp] 14 bmp Vt Set:  [400 mL] 400 mL PEEP:  [8 cmH20-10 cmH20] 8 cmH20 Plateau Pressure:  [18 cmH20-31 cmH20] 23 cmH20   Intake/Output Summary (Last 24 hours) at 10/03/2019 1052 Last data filed at 10/03/2019 0800 Gross per 24 hour  Intake 2055.26 ml  Output 1075 ml  Net 980.26 ml    Filed Weights   10/01/19 1922 10/02/19 2000 10/03/19 0454  Weight: 60.8 kg 59.1 kg 60.1 kg    Examination: General: Chronically ill-appearing, NAD HENT: trach, site clean, perrl Lungs: decreased BS, crackles at L base.  Cardiovascular: RRR, frequent PACs Abdomen: NT, ND, NBS Extremities: no edema no tenderness Neuro: Interactive, nonfocal, power 3/5 all 4 extremities  Chest x-ray personally reviewed 10/29 shows improved left lower lobe aeration compared to prior film, but volume loss on right with likely right lower lobe atelectasis  Labs show normal electrolytes, no leukocytosis and stable anemia  Resolved Hospital Problem list     Assessment & Plan:  Acute hypoxemic respiratory failure:  LLL HCAP  Right lower lobe atelectasis 10/29  -Bronchoscopy performed with removal of thick inspissated mucoid secretions from both lower lobes, BAL sent -Add hypertonic saline and chest vest for pulmonary hygiene -Keep PEEP at 10 for another 24 hours and then can drop down to 5 -deCrease FiO2 as able, doubt that she is a candidate for weaning long-term  ALS: s/p tracheostomy, now chronically ventilated 24/7.     Septic shock-resolved Continue cefepime until cultures back Discontinue vancomycin and Flagyl since MRSA PCR negative   AKI -resolved  Protein calorie malnutrition, mild-start tube feeds via J-tube  On IV hydrocortisone-for unclear reason, [place] [facility] [place] [facility], discussed with daughter,  will change to 20 mg of prednisone and slowly taper over the next 2 weeks   Best practice:  Diet: npo, tube feeds Pain/Anxiety/Delirium protocol (if indicated): fent prn VAP protocol (if indicated):  DVT prophylaxis: lovenox daily GI prophylaxis: protonix Glucose control: ISS Mobility: bedrest Code Status: Full Family Communication: Daughter , Leafy Ro 10/29 Disposition: ICU   The patient is critically ill with multiple  organ systems failure and requires high complexity decision making for assessment and support, frequent evaluation and titration of therapies, application of advanced monitoring technologies and extensive interpretation of multiple databases. Critical Care Time devoted to patient care services described in this note independent of APP/resident  time is 32 minutes.    Kara Mead MD Board Certified in [place]

## 2019-10-03 NOTE — Procedures (Signed)
Bronchoscopy Procedure Note Emily Moody [id number] 1951/03/08  Procedure: Bronchoscopy Indications: Obtain specimens for culture and/or other diagnostic studies and Remove secretions RLL atelectasis  Procedure Details Consent: Risks of procedure as well as the alternatives and risks of each were explained to the (patient/caregiver).  Consent for procedure obtained. Time Out: Verified patient identification, verified procedure, site/side was marked, verified correct patient position, special equipment/implants available, medications/allergies/relevent history reviewed, required imaging and test results available.  Performed  In preparation for procedure, patient was given 100% FiO2 and bronchoscope lubricated. Sedation: 1 mg versed + 100 mcg fentanyl  Airway entered and the following bronchi were examined: Bronchi.  Thick inspissated purulent secretions suctioned out from both lower lobes. Procedures performed: BAL performed from RLL Bronchoscope removed.  , Patient placed back on 100% FiO2 at conclusion of procedure.    Evaluation Hemodynamic Status: BP stable throughout; O2 sats: stable throughout Patient's Current Condition: stable Specimens:  Sent purulent fluid Complications: No apparent complications Patient did tolerate procedure well.   Emily Moody Emily Moody [place] 10/03/2019

## 2019-10-03 NOTE — Progress Notes (Addendum)
CSW received call from [place] on 53M regarding the patient's desire to not return to [facility] at discharge.  CSW attempted to reach Santiago Glad at [facility] without success, a voicemail was left requesting a return call. CSW spoke with Trey Sailors in admissions at [facility] who agreed to review this patient for possible admission, CSW will prepare clinical documentation and submit it for review.  CSW attempted to each Gae Bon at [facility] without success, a voicemail was left requesting a return call.  CSW spoke with patient's daughter Leafy Ro to inform her of attempts to secure a bed offer at an alternative facility, Leafy Ro is agreeable to plan. Leafy Ro reports her mother has only been at [facility] for one week, so she unsure if she is just anxious or being picky about the facility.   Madilyn Fireman, MSW, LCSW-A Transitions of Care  Clinical Social Worker  [facility] Emergency Departments  Medical ICU [phone]

## 2019-10-03 NOTE — Progress Notes (Signed)
Initial Nutrition Assessment RD working remotely.  DOCUMENTATION CODES:   Not applicable  INTERVENTION:    Jevity 1.2 at 30 ml/h, increase by 10 ml every 4 hours to goal rate of 50 ml/h (1200 ml per day)   Pro-stat 30 ml BID   Provides 1640 kcal, 97 gm protein, 972 ml free water daily   Add MVI with minerals daily  NUTRITION DIAGNOSIS:   Inadequate oral intake related to inability to eat as evidenced by NPO status.  GOAL:   Patient will meet greater than or equal to 90% of their needs  MONITOR:   TF tolerance, Skin, I & O's, Labs  REASON FOR ASSESSMENT:   Ventilator, Consult Enteral/tube feeding initiation and management  ASSESSMENT:   68 yo female admitted from [place] with LLL PNA. PMH includes ALS, chronic trach & vent, HTN, GERD, depression.   Received MD Consult for TF initiation via J-tube.  Patient is on chronic ventilator support via trach. MV: 8.8 L/min Temp (24hrs), Avg:98.5 F (36.9 C), Min:98 F (36.7 C), Max:99.2 F (37.3 C)    Labs reviewed.  CBG's: 94-91  Medications reviewed and include novolog, prednisone.  IVF: NS with KCl 40 mEq/L at 50 ml/h.  NUTRITION - FOCUSED PHYSICAL EXAM:  unable to complete  Diet Order:   Diet Order            Diet NPO time specified  Diet effective now              EDUCATION NEEDS:   No education needs have been identified at this time  Skin:  Skin Assessment: Skin Integrity Issues: Skin Integrity Issues:: Stage III Stage III: sacrum  Last BM:  10/29 type 6  Height:   Ht Readings from Last 1 Encounters:  10/02/19 5\' 5"  (1.651 m)    Weight:   Wt Readings from Last 1 Encounters:  10/03/19 60.1 kg    Ideal Body Weight:  56.8 kg  BMI:  Body mass index is 22.05 kg/m.  Estimated Nutritional Needs:   Kcal:  1550-1750  Protein:  90-100  Fluid:  >/= 1.5 L    Molli Barrows, RD, LDN, [place] Pager [phone] After Hours Pager [phone]

## 2019-10-03 NOTE — Progress Notes (Signed)
Pharmacy Antibiotic Note  Emily Moody is a 68 y.o. female admitted on 10/01/2019 with sepsis.  Pharmacy has been consulted for cefepime dosing for sepsis likely caused by pneumonia. Patient has been receiving vancomycin 1242m IV q24h, metronidazole 5050mIV q8h and cefepime 2g IV q12h. Vancomycin and metronidazole were discontinued today since trach aspirate is growing GPCs in pairs and MRSA PCR was negative. Scr has improved from 0.62 to <0.3 which is approximately the patient's baseline. Current CrCl would be ~61.4 ml/min if adjusted Scr to 0.8 due to the patient being elderly. Will dose adjust cefepime based on CrCl >6060min. WBC 11.6. Afebrile. Lactic acid 0.8.   Plan:  Change cefepime to 2g IV q8h  Monitor renal function, urine output, cultures/sensitivities, and clinical progression  Height: _0  (165.1 cm) Weight: 132 lb 7.9 oz (60.1 kg) IBW/kg (Calculated) : 57  Temp (24hrs), Avg:98.5 F (36.9 C), Min:98 F (36.7 C), Max:99.2 F (37.3 C)  Recent Labs  Lab 10/01/19 1850 10/01/19 2104 10/02/19 0554 10/02/19 1551 10/02/19 2100 10/03/19 0611  WBC 16.8*  --  11.6*  --   --  7.3  CREATININE 0.62  --  0.33*  --   --  <0.30*  LATICACIDVEN 2.7* 3.4*  --  1.0 0.8  --     CrCl cannot be calculated (This lab value cannot be used to calculate CrCl because it is not a number: <0.30).    Allergies  Allergen Reactions  . Morphine     Other reaction(s): Delusions (intolerance), Hallucinations, Mental Status Changes (intolerance)    Antimicrobials this admission: Cefepime 10/27 >>  Metronidazole 10/27 >> 10/29 Vancomycin 10/27 >> 10/29  Antimicrobial dose adjustments this admission: cefepime 2g IV q12h to cefepime 2g IV q8h due to improvement in renal function   Microbiology results: 10/27 Ucx: no growth 10/27 Bcx: ng <24h 10/28 MRSA PCR: negative 10/28 TA - rare GPC in pairs  10/29 BAL - obtained   Thank you for allowing pharmacy to be a part of this patient's  care.  GraCristela FeltharmD PGY1 Pharmacy Resident [employer]: [phone]/29/2020 1:49 PM

## 2019-10-03 NOTE — Consult Note (Signed)
[place] Nurse wound consult note Patient receiving care in [facility] 2M08.  Consult completed remotely after review of record, no photos of area available. Reason for Consult: sacral wound Wound type: documented on flowsheet as a stage 3 without depth Pressure Injury POA: Yes/No/NA Measurement: see flowsheet Wound bed: pink, dry per flowsheet entry Drainage (amount, consistency, odor) none indicatted Periwound: see flowsheet Dressing procedure/placement/frequency: Foam dressing to area, turn right or left but not supine to offload pressure to site. I will plan to perform an in person assessment 10/30 when I am in the building. Val Riles, RN, MSN, CWOCN, CNS-BC, pager [phone]

## 2019-10-04 DIAGNOSIS — A419 Sepsis, unspecified organism: Secondary | ICD-10-CM | POA: Diagnosis not present

## 2019-10-04 DIAGNOSIS — Z9911 Dependence on respirator [ventilator] status: Secondary | ICD-10-CM

## 2019-10-04 DIAGNOSIS — R5381 Other malaise: Secondary | ICD-10-CM

## 2019-10-04 DIAGNOSIS — G1221 Amyotrophic lateral sclerosis: Secondary | ICD-10-CM | POA: Diagnosis not present

## 2019-10-04 DIAGNOSIS — J189 Pneumonia, unspecified organism: Secondary | ICD-10-CM | POA: Diagnosis not present

## 2019-10-04 LAB — CBC
HCT: 24.9 % — ABNORMAL LOW (ref 36.0–46.0)
Hemoglobin: 7.5 g/dL — ABNORMAL LOW (ref 12.0–15.0)
MCH: 28.7 pg (ref 26.0–34.0)
MCHC: 30.1 g/dL (ref 30.0–36.0)
MCV: 95.4 fL (ref 80.0–100.0)
Platelets: 271 10*3/uL (ref 150–400)
RBC: 2.61 MIL/uL — ABNORMAL LOW (ref 3.87–5.11)
RDW: 13.4 % (ref 11.5–15.5)
WBC: 5.8 10*3/uL (ref 4.0–10.5)
nRBC: 0 % (ref 0.0–0.2)

## 2019-10-04 LAB — MAGNESIUM: Magnesium: 2 mg/dL (ref 1.7–2.4)

## 2019-10-04 LAB — GLUCOSE, CAPILLARY
Glucose-Capillary: 120 mg/dL — ABNORMAL HIGH (ref 70–99)
Glucose-Capillary: 128 mg/dL — ABNORMAL HIGH (ref 70–99)
Glucose-Capillary: 146 mg/dL — ABNORMAL HIGH (ref 70–99)
Glucose-Capillary: 159 mg/dL — ABNORMAL HIGH (ref 70–99)
Glucose-Capillary: 186 mg/dL — ABNORMAL HIGH (ref 70–99)
Glucose-Capillary: 78 mg/dL (ref 70–99)

## 2019-10-04 LAB — BASIC METABOLIC PANEL
Anion gap: 5 (ref 5–15)
BUN: 15 mg/dL (ref 8–23)
CO2: 26 mmol/L (ref 22–32)
Calcium: 9.1 mg/dL (ref 8.9–10.3)
Chloride: 115 mmol/L — ABNORMAL HIGH (ref 98–111)
Creatinine, Ser: <0.3 mg/dL — ABNORMAL LOW (ref 0.44–1.00)
Glucose, Bld: 139 mg/dL — ABNORMAL HIGH (ref 70–99)
Potassium: 4.5 mmol/L (ref 3.5–5.1)
Sodium: 146 mmol/L — ABNORMAL HIGH (ref 135–145)

## 2019-10-04 MED ORDER — FENTANYL CITRATE (PF) 100 MCG/2ML IJ SOLN
25.0000 ug | INTRAMUSCULAR | Status: DC | PRN
  Administered 2019-10-04 (×2): 25 ug via INTRAVENOUS
  Filled 2019-10-04 (×2): qty 2

## 2019-10-04 MED ORDER — COLLAGENASE 250 UNIT/GM EX OINT
TOPICAL_OINTMENT | Freq: Every day | CUTANEOUS | Status: DC
  Administered 2019-10-04 – 2019-10-09 (×6): via TOPICAL
  Administered 2019-10-10 – 2019-10-11 (×2): 1 via TOPICAL
  Administered 2019-10-12: 10:00:00 via TOPICAL
  Filled 2019-10-04: qty 30

## 2019-10-04 MED ORDER — CEFAZOLIN SODIUM-DEXTROSE 2-4 GM/100ML-% IV SOLN
2.0000 g | Freq: Three times a day (TID) | INTRAVENOUS | Status: AC
  Administered 2019-10-04 – 2019-10-07 (×10): 2 g via INTRAVENOUS
  Filled 2019-10-04 (×10): qty 100

## 2019-10-04 MED ORDER — CEFAZOLIN SODIUM-DEXTROSE 2-4 GM/100ML-% IV SOLN
2.0000 g | Freq: Three times a day (TID) | INTRAVENOUS | Status: DC
  Filled 2019-10-04 (×2): qty 100

## 2019-10-04 NOTE — Consult Note (Signed)
[place] Nurse wound consult note Patient receiving care in [facility] 2M8.  Assisted with turning by primary RN. Reason for Consult: previously consulted for a stage 3 sacral wound Wound type: unstageable coccyx wound Pressure Injury POA: Yes Measurement: 2 cm x 2.2 cm Wound bed: 100% yellow/white slough Drainage (amount, consistency, odor) serosanginous on existing foam dressing. Periwound: intact Dressing procedure/placement/frequency: Apply Santyl to the coccyx wound in a nickel thick layer. Cover with saline moistened gauze and ABD pad. Tape in place.Continue to turn right or left and off load pressure to the area. Monitor the wound area(s) for worsening of condition such as: Signs/symptoms of infection,  Increase in size,  Development of or worsening of odor, Development of pain, or increased pain at the affected locations.  Notify the medical team if any of these develop.  Thank you for the consult.  Discussed plan of care with the bedside nurse.  [place] nurse will not follow at this time.  Please re-consult the [place] team if needed.  Val Riles, RN, MSN, CWOCN, CNS-BC, pager [phone]

## 2019-10-04 NOTE — Progress Notes (Signed)
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    History of present illness   Admitted from [place] for increased SOB, increasing pulmonary infiltrate on CXR.  Was requiring higher than usual fio2 (60%) and higher peak pressures.   Increased pressures, and increased FIO2 needs. Hypotensive to 60s on arrival   Recent note from Pulmonology NP 09/22/19 at [facility]: on full vent support, FIO2 40% Rate 18, TV 400 PEEP 5.  They had not been successful with weaning. Had been treated for aspiration PNA, ARDS, improved.  Was discharged to [facility].   Chart review says hydrocortisone 179m Daily, but not on daughter's med list.    [facility]    Past Medical History  ALS HTN GERD Depression Decubitus ulcer  Significant Hospital Events      Consults:    Procedures:    Significant Diagnostic Tests:  CXR:   Micro Data:  Blood cultures 10/27 >> ng Sputum cultures 10/28 >> GPC pairs >> BAL 10/29 >>  Antimicrobials:  10/27 -  Cefepime >> 10/27 - Vanc >>> 10/29 10/27 - flagyl >> 10/29  Interim history/subjective:   She denies complaints, including pain. [facility]   In speaking with her daughter MLeafy Ro who is an RT, [facility] to [facility] in [place] she could still walk with a walker (had just started needing in July 2020), needing her vent only at night. Had most predominant bulbar symptoms at baseline- hasn't been able to talk in about 1 year. Was using cough assist at home prior to trach (doing it herself). She was participating in most of her own self care prior to her Sept hospitalizations- dressed herself, was able to do her own cough assist and  put herself on the vent.     Objective   Blood pressure 135/69, pulse 68, temperature 98.3 F (36.8 C), temperature source Oral, resp. rate 19, height _0  (1.651 m), weight 60.2 kg, SpO2 100 %.    Vent Mode: PRVC FiO2 (%):  [40 %-60 %] 40 % Set Rate:  [14 bmp] 14 bmp Vt Set:  [400 mL] 400 mL PEEP:  [8 cmH20] 8 cmH20 Plateau Pressure:  [18 cmH20-21 cmH20] 21 cmH20   Intake/Output Summary (Last 24 hours) at 10/04/2019 1559 Last data filed at 10/04/2019 1400 Gross per 24 hour  Intake 2180.61 ml  Output 2075 ml  Net 105.61 ml   Filed Weights   10/02/19 2000 10/03/19 0454 10/04/19 0500  Weight: 59.1 kg 60.1 kg 60.2 kg    Examination: General: Chronically ill-appearing woman lying in bed no acute distress HENT: [place]/AT, eyes anicteric Neck: trach in place without significant secretions or erythema Lungs: Decreased basilar breath sounds, rhonchi cleared with suctioning.  Fair amount of thick tan-colored secretions. Cardiovascular: Regular rate and rhythm Abdomen: PEG in place without erythema or drainage around the site, soft and nontender Extremities: No edema or foot wounds Neuro: Interactive, nodding to answer questions.  Globally weak.  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 [facility]  VAP at admission from [place]. Previous bronchoscopy performed with removal of thick inspissated mucoid secretions from both lower lobes, BAL sent -Continue hypertonic saline and chest PT -Adding CoughAssist twice daily; this should be permanent part of her regimen.  Her daughter agrees. -Continue current vent settings.  Continue to titrate down FiO2 as able to maintain SPO2 greater than 88%.  Acute on chronic weakness due to ALS: s/p tracheostomy, now chronically ventilated 24/7.    -Continue chronic vent support -We will add  physical therapy and Occupational Therapy with hopes for mild increase in functional status, which could improve patient's quality of life. -Attempting to get her home ALS med from [place]- her daughter is working on this currently  Sepsis due to bilateral LL pneumonia;  Shock resolved. Staph aureus pneumonia. -Okay to de-escalate to ceftriaxone; will reescalate if fevers recur  AKI -resolved -Continue to monitor  Protein calorie malnutrition, mild -Continue tube feeds   Chronic glucocorticoid use-on IV hydrocortisone for unclear reason, apparently she was discharged on this from [place] and this was continued at [facility] , discussed with daughter -Continue 20 mg of prednisone and slowly taper over the next 2 weeks   Best practice:  Diet: npo, tube feeds Pain/Anxiety/Delirium protocol (if indicated): fent prn VAP protocol (if indicated):  DVT prophylaxis: lovenox daily GI prophylaxis: protonix Glucose control: SSI Mobility: bedrest Code Status: Full Family Communication: Daughter , Leafy Ro 10/30 Disposition: ICU  This patient is critically ill with multiple organ system failure which requires frequent high complexity decision making, assessment, support, evaluation, and titration of therapies. This was completed through the application of advanced monitoring technologies and extensive interpretation of multiple databases. During this encounter critical care time was devoted to patient care services described in this note for 45 minutes.  Julian Hy, DO 10/04/19 4:39 PM [facility]

## 2019-10-04 NOTE — Progress Notes (Addendum)
12pm: CSW received notification from [place] at [facility] stating that the facility current does not have any electricity but Santiago Glad in admissions will call CSW with a decision ASAP.  8am: CSW sent clinical information to Audelia Acton at [facility] for review.  Madilyn Fireman, MSW, LCSW-A Transitions of Care  Clinical Social Worker  [facility] Emergency Departments  Medical ICU [phone]

## 2019-10-05 DIAGNOSIS — G1221 Amyotrophic lateral sclerosis: Secondary | ICD-10-CM | POA: Diagnosis not present

## 2019-10-05 DIAGNOSIS — Z9911 Dependence on respirator [ventilator] status: Secondary | ICD-10-CM | POA: Diagnosis not present

## 2019-10-05 DIAGNOSIS — J189 Pneumonia, unspecified organism: Secondary | ICD-10-CM | POA: Diagnosis not present

## 2019-10-05 DIAGNOSIS — R5381 Other malaise: Secondary | ICD-10-CM | POA: Diagnosis not present

## 2019-10-05 LAB — GLUCOSE, CAPILLARY
Glucose-Capillary: 102 mg/dL — ABNORMAL HIGH (ref 70–99)
Glucose-Capillary: 115 mg/dL — ABNORMAL HIGH (ref 70–99)
Glucose-Capillary: 136 mg/dL — ABNORMAL HIGH (ref 70–99)
Glucose-Capillary: 139 mg/dL — ABNORMAL HIGH (ref 70–99)
Glucose-Capillary: 150 mg/dL — ABNORMAL HIGH (ref 70–99)
Glucose-Capillary: 180 mg/dL — ABNORMAL HIGH (ref 70–99)

## 2019-10-05 LAB — CULTURE, BAL-QUANTITATIVE W GRAM STAIN: Culture: 70000 — AB

## 2019-10-05 LAB — CULTURE, RESPIRATORY W GRAM STAIN

## 2019-10-05 LAB — CBC
HCT: 23.4 % — ABNORMAL LOW (ref 36.0–46.0)
Hemoglobin: 7.2 g/dL — ABNORMAL LOW (ref 12.0–15.0)
MCH: 28.7 pg (ref 26.0–34.0)
MCHC: 30.8 g/dL (ref 30.0–36.0)
MCV: 93.2 fL (ref 80.0–100.0)
Platelets: 246 10*3/uL (ref 150–400)
RBC: 2.51 MIL/uL — ABNORMAL LOW (ref 3.87–5.11)
RDW: 13.2 % (ref 11.5–15.5)
WBC: 6 10*3/uL (ref 4.0–10.5)
nRBC: 0 % (ref 0.0–0.2)

## 2019-10-05 MED ORDER — PREDNISONE 5 MG/5ML PO SOLN
15.0000 mg | Freq: Every day | ORAL | Status: DC
  Administered 2019-10-06 – 2019-10-07 (×2): 15 mg
  Filled 2019-10-05 (×2): qty 15

## 2019-10-05 MED ORDER — FENTANYL CITRATE (PF) 100 MCG/2ML IJ SOLN
25.0000 ug | INTRAMUSCULAR | Status: DC | PRN
  Administered 2019-10-05 (×4): 75 ug via INTRAVENOUS
  Administered 2019-10-05: 50 ug via INTRAVENOUS
  Administered 2019-10-05 – 2019-10-08 (×4): 75 ug via INTRAVENOUS
  Administered 2019-10-08: 50 ug via INTRAVENOUS
  Filled 2019-10-05 (×11): qty 2

## 2019-10-05 MED ORDER — PANTOPRAZOLE SODIUM 40 MG PO PACK
40.0000 mg | PACK | Freq: Every day | ORAL | Status: DC
  Administered 2019-10-05 – 2019-10-07 (×3): 40 mg
  Filled 2019-10-05 (×3): qty 20

## 2019-10-05 NOTE — Evaluation (Signed)
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 Admitted from [place] for increased SOB, increasing pulmonary infiltrate on CXR. Per daughter (an RT at [facility]), she had her trach in late Sept at [place]. Prior to admission in September she could still walk with a walker (had just started needing in July 2020), needing her vent only at night, hasn't been able to talk in about 1 year.   Clinical Impression   Pt admitted with above diagnosis. Patient has recent prolonged hospitalization with vent dependence, however in September was living at home, walking with walker and using ventilator at night. Patient's VSS during mobility with max RR 32. Patient able to dangle EOB with +1 assist, however unable to stand. Pt currently with functional limitations due to the deficits listed below (see PT Problem List). Pt will benefit from skilled PT to increase their independence and safety with mobility to allow discharge to the venue listed below.       Follow Up Recommendations LTACH;SNF;Supervision/Assistance - 24 hour (can benefit from therapy in either setting, likely medical needs will dictate level of care).    Equipment Recommendations  None recommended by PT    Recommendations for Other Services       Precautions / Restrictions Precautions Precautions: Fall;Other (comment) Precaution Comments: multiple lines, tubes      Mobility  Bed Mobility Overal bed mobility: Needs Assistance Bed Mobility: Rolling;Sidelying to Sit;Sit to Sidelying Rolling: Min assist;Mod assist Sidelying to sit: Mod assist     Sit to sidelying: Max assist General bed mobility comments: with increasing fatigue, required incr assist  Transfers Overall transfer level: Needs assistance Equipment used: (bed  pad under pelvis) Transfers: Lateral/Scoot Transfers          Lateral/Scoot Transfers: Max assist General transfer comment: to pt's left along EOB; initial attempt with good clearance of hips, on 2nd attempt, pt unable to clear hips off bed  Ambulation/Gait                Stairs            Wheelchair Mobility    Modified Rankin (Stroke Patients Only)       Balance Overall balance assessment: Needs assistance Sitting-balance support: Bilateral upper extremity supported;Feet unsupported Sitting balance-Leahy Scale: Poor   Postural control: Other (comment)(anterior lean)                                   Pertinent Vitals/Pain Pain Assessment: Faces Faces Pain Scale: Hurts even more Pain Location: points to her coccyx (has pressure sore) Pain Descriptors / Indicators: Grimacing;Guarding Pain Intervention(s): Limited activity within patient's tolerance;Monitored during session;Premedicated before session;Repositioned;Patient requesting pain meds-RN notified;Relaxation    Home Living Family/patient expects to be discharged to:: Other (Comment)                 Additional Comments: LTACH or ?SNF with vents    Prior Function Level of Independence: Needs assistance   Gait / Transfers Assistance Needed: unclear her mobility level on admission from [facility]; before was hospitalized in Sept, she was walking with RW and using vent at night           Hand Dominance        Extremity/Trunk Assessment  Upper Extremity Assessment Upper Extremity Assessment: Generalized weakness(tightness rt finger flexors; grossly 2+ to 3- throughout)    Lower Extremity Assessment Lower Extremity Assessment: Generalized weakness;RLE deficits/detail;LLE deficits/detail RLE Deficits / Details: AAROM WFL except ankle dorsiflexion -15 from neutral; hip flexion 3+ (supine), knee extension 3+ (supine), knee flexion 2+, ankle DF 3+ LLE Deficits / Details: AAROM  WFL; hip flexion 2+, knee extension 3+, knee flexion 2+, ankle DF 2+    Cervical / Trunk Assessment Cervical / Trunk Assessment: Kyphotic  Communication   Communication: Tracheostomy;Other (comment)(on vent)  Cognition Arousal/Alertness: Lethargic;Suspect due to medications Behavior During Therapy: Flat affect Overall Cognitive Status: Difficult to assess                                 General Comments: following 1 step commands with some delay      General Comments General comments (skin integrity, edema, etc.): on vent: PRVC FiO2 40%, RR 24-30; SaO2 97-100%    Exercises     Assessment/Plan    PT Assessment Patient needs continued PT services  PT Problem List Decreased strength;Decreased range of motion;Decreased activity tolerance;Decreased balance;Decreased mobility;Decreased knowledge of use of DME;Cardiopulmonary status limiting activity;Pain       PT Treatment Interventions DME instruction;Functional mobility training;Therapeutic activities;Therapeutic exercise;Balance training;Cognitive remediation;Patient/family education    PT Goals (Current goals can be found in the Care Plan section)  Acute Rehab PT Goals Patient Stated Goal: pt unable to state; gave thumbs up to getting stronger PT Goal Formulation: Patient unable to participate in goal setting Time For Goal Achievement: 10/19/19 Potential to Achieve Goals: Fair    Frequency Min 2X/week   Barriers to discharge        Co-evaluation               AM-PAC PT "6 Clicks" Mobility  Outcome Measure Help needed turning from your back to your side while in a flat bed without using bedrails?: A Little Help needed moving from lying on your back to sitting on the side of a flat bed without using bedrails?: A Lot Help needed moving to and from a bed to a chair (including a wheelchair)?: Total Help needed standing up from a chair using your arms (e.g., wheelchair or bedside chair)?: Total Help  needed to walk in hospital room?: Total Help needed climbing 3-5 steps with a railing? : Total 6 Click Score: 9    End of Session Equipment Utilized During Treatment: Oxygen Activity Tolerance: Patient limited by fatigue Patient left: in bed;with call bell/phone within reach Nurse Communication: Mobility status;Other (comment)(dangle EOB) PT Visit Diagnosis: Muscle weakness (generalized) (M62.81);Difficulty in walking, not elsewhere classified (R26.2);Other symptoms and signs involving the nervous system (R29.898)    Time: 1320-1406 PT Time Calculation (min) (ACUTE ONLY): 46 min   Charges:   PT Evaluation $PT Eval High Complexity: 1 High PT Treatments $Therapeutic Activity: 23-37 mins         Veda Canning, PT Pager [phone]   Zena Amos 10/05/2019, 2:33 PM

## 2019-10-05 NOTE — Progress Notes (Signed)
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    History of present illness   Admitted from [place] for increased SOB, increasing pulmonary infiltrate on CXR.  Was requiring higher than usual fio2 (60%) and higher peak pressures.   Increased pressures, and increased FIO2 needs. Hypotensive to 60s on arrival   Recent note from Pul[facility] NP 09/22/19 at [facility]: on full vent support, FIO2 40% Rate 18, TV 400 PEEP 5.  They had not been successful with weaning. Had been treated for aspiration PNA, ARDS, improved.  Was discharged to [facility].   Chart review says hydrocortisone 167m Daily, but not on daughter's med list.    Daughter is at RT at [facility] [facility] [employer]    Past Medical History  ALS HTN GERD Depression Decubitus ulcer  Significant Hospital Events      Consults:  Wound care  Procedures:    Significant Diagnostic Tests:  CXR:   Micro Data:  Blood cultures 10/27 >>  Sputum cultures 10/28 >> staph aureus, susceptibilities pending BAL 10/29 >>  Antimicrobials:  10/27 -  Cefepime >> 10/27 - Vanc >>> 10/29 10/27 - flagyl >> 10/29  Interim history/subjective:   She denies complaints, including pain. [facility]   In speaking with her daughter MLeafy Ro who is an RT, she [place] [place] late [place] Prior to admission in September she could still walk with a walker (had just started needing in July 2020), needing her vent only at night. Had most predominant bulbar symptoms at baseline- hasn't been able to talk in about 1 year. Was using cough assist at home prior to trach (doing it herself). She was participating in most of her own self care prior to her Sept hospitalizations- dressed herself, was  able to do her own cough assist and put herself on the vent.     Objective   Blood pressure 133/81, pulse 69, temperature 98.2 F (36.8 C), temperature source Oral, resp. rate 16, height _0  (1.651 m), weight 59.8 kg, SpO2 97 %.    Vent Mode: PRVC FiO2 (%):  [40 %-60 %] 40 % Set Rate:  [14 bmp] 14 bmp Vt Set:  [400 mL] 400 mL PEEP:  [5 cmH20-8 cmH20] 5 cmH20 Plateau Pressure:  [18 cmH20-24 cmH20] 18 cmH20   Intake/Output Summary (Last 24 hours) at 10/05/2019 1139 Last data filed at 10/05/2019 1100 Gross per 24 hour  Intake 2345.52 ml  Output 2040 ml  Net 305.52 ml   Filed Weights   10/03/19 0454 10/04/19 0500 10/05/19 0318  Weight: 60.1 kg 60.2 kg 59.8 kg    Examination: General: Chronically ill-appearing, frail woman lying in bed no acute distress HENT: [place]/AT, eyes anicteric Neck: Trach in place, no surrounding erythema Lungs: Decreased basilar breath sounds, less secretions, no rhonchi.  Tolerating pressure support 10/8. Cardiovascular: Regular rate and rhythm, no murmurs Abdomen: PEG in place without erythema, soft and nontender Extremities: No edema or foot wounds.  Hand musculature wasting. Neuro: Interactive, attempting to talk.  Able to lift her arms off the bed.  Able to bend both knees, foot drop on the right. 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 Trached late Sept at [place]. Bilateral VAP at admission from [place]. Previous bronchoscopy performed with removal of thick inspissated mucoid secretions from both lower lobes, BAL sent -CoughAssist twice daily -Continue to CPT and hypertonic saline -We will resume vent weaning trials-best I can tell she was working on this prior to leaving [facility].  Acute on chronic weakness due to ALS: s/p tracheostomy, now chronically ventilated 24/7.    -Continue vent support -PT and OT consulted -Need to determine her ALS  medication is available from [facility]  Sepsis due to bilateral LL pneumonia;  Shock resolved. Staph aureus pneumonia. -Continue ceftriaxone; will reescalate if fevers recur.  AKI -resolved -Continue to monitor  Protein calorie malnutrition, mild -Continue tube feeds  Chronic glucocorticoid use-on IV hydrocortisone for unclear reason, apparently she was discharged on this from [place] and this was continued at [place] , discussed with daughter -Decrease prednisone to 15 mg daily and slowly taper over the next 1 to 2 weeks.   Best practice:  Diet: npo, tube feeds Pain/Anxiety/Delirium protocol (if indicated): fent prn VAP protocol (if indicated):  DVT prophylaxis: lovenox daily GI prophylaxis: protonix Glucose control: SSI Mobility: bedrest Code Status: Full Family Communication: Daughter , Leafy Ro 10/30 Disposition: ICU  This patient is critically ill with multiple organ system failure which requires frequent high complexity decision making, assessment, support, evaluation, and titration of therapies. This was completed through the application of advanced monitoring technologies and extensive interpretation of multiple databases. During this encounter critical care time was devoted to patient care services described in this note for 40 minutes.  Julian Hy, DO 10/05/19 11:39 AM [facility]

## 2019-10-06 DIAGNOSIS — J15211 Pneumonia due to Methicillin susceptible Staphylococcus aureus: Secondary | ICD-10-CM

## 2019-10-06 DIAGNOSIS — J9622 Acute and chronic respiratory failure with hypercapnia: Secondary | ICD-10-CM

## 2019-10-06 LAB — POCT I-STAT 7, (LYTES, BLD GAS, ICA,H+H)
Acid-Base Excess: 7 mmol/L — ABNORMAL HIGH (ref 0.0–2.0)
Bicarbonate: 29.9 mmol/L — ABNORMAL HIGH (ref 20.0–28.0)
Calcium, Ion: 1.28 mmol/L (ref 1.15–1.40)
HCT: 24 % — ABNORMAL LOW (ref 36.0–46.0)
Hemoglobin: 8.2 g/dL — ABNORMAL LOW (ref 12.0–15.0)
O2 Saturation: 98 %
Potassium: 4.2 mmol/L (ref 3.5–5.1)
Sodium: 134 mmol/L — ABNORMAL LOW (ref 135–145)
TCO2: 31 mmol/L (ref 22–32)
pCO2 arterial: 36.9 mmHg (ref 32.0–48.0)
pH, Arterial: 7.517 — ABNORMAL HIGH (ref 7.350–7.450)
pO2, Arterial: 96 mmHg (ref 83.0–108.0)

## 2019-10-06 LAB — BASIC METABOLIC PANEL
Anion gap: 11 (ref 5–15)
BUN: 12 mg/dL (ref 8–23)
CO2: 28 mmol/L (ref 22–32)
Calcium: 8.9 mg/dL (ref 8.9–10.3)
Chloride: 98 mmol/L (ref 98–111)
Creatinine, Ser: 0.34 mg/dL — ABNORMAL LOW (ref 0.44–1.00)
GFR calc Af Amer: >60 mL/min (ref >60)
GFR calc non Af Amer: >60 mL/min (ref >60)
Glucose, Bld: 131 mg/dL — ABNORMAL HIGH (ref 70–99)
Potassium: 4.4 mmol/L (ref 3.5–5.1)
Sodium: 137 mmol/L (ref 135–145)

## 2019-10-06 LAB — GLUCOSE, CAPILLARY
Glucose-Capillary: 132 mg/dL — ABNORMAL HIGH (ref 70–99)
Glucose-Capillary: 126 mg/dL — ABNORMAL HIGH (ref 70–99)
Glucose-Capillary: 132 mg/dL — ABNORMAL HIGH (ref 70–99)
Glucose-Capillary: 183 mg/dL — ABNORMAL HIGH (ref 70–99)
Glucose-Capillary: 116 mg/dL — ABNORMAL HIGH (ref 70–99)

## 2019-10-06 LAB — CULTURE, BLOOD (ROUTINE X 2)
Culture: NO GROWTH
Culture: NO GROWTH
Special Requests: ADEQUATE
Special Requests: ADEQUATE

## 2019-10-06 MED ORDER — BISACODYL 10 MG RE SUPP: 10.0000 mg | Freq: Every day | RECTAL | Status: DC | PRN

## 2019-10-06 MED ORDER — OXYCODONE HCL 5 MG/5ML PO SOLN
10.0000 mg | Freq: Once | ORAL | Status: AC
  Administered 2019-10-06: 10 mg
  Filled 2019-10-06: qty 10

## 2019-10-06 MED ORDER — DOCUSATE SODIUM 50 MG/5ML PO LIQD
100.0000 mg | Freq: Two times a day (BID) | ORAL | Status: DC | PRN
  Administered 2019-10-06: 100 mg
  Filled 2019-10-06 (×2): qty 10

## 2019-10-06 NOTE — Progress Notes (Signed)
eLink Physician-Brief Progress Note Patient Name: Emily Moody DOB: September 26, 1951 MRN: [id number]   Date of Service  10/06/2019  HPI/Events of Note  Constipation - Request for bowel regimen.   eICU Interventions  Will order: 1. Colace liquid 100 mg per tube BID PRN constipation.  2. Dulcolax Supp 10 mg PR Q day PRN constipation.     Intervention Category Major Interventions: Other:  Lillyen Schow Cornelia Copa 10/06/2019, 8:00 PM

## 2019-10-06 NOTE — Progress Notes (Signed)
ABG results as follows:  PH  7.52  pco2  37  po2  96  Hco3- 30.  Vent settings:  Vt 500 mls, PRVC rate 14, fio2 .35, PEEP 8.  ABG results hand-delivered to Dr. Carlis Abbott on the unit.  Orders received to decrease PRVC rate to 12.  Will continue to monitor.

## 2019-10-06 NOTE — Progress Notes (Signed)
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    History of present illness   Admitted from [place] for increased SOB, increasing pulmonary infiltrate on CXR.  Was requiring higher than usual fio2 (60%) and higher peak pressures.   Increased pressures, and increased FIO2 needs. Hypotensive to 60s on arrival   Recent note from Pulmonology NP 09/22/19 at [facility]: on full vent support, FIO2 40% Rate 18, TV 400 PEEP 5.  They had not been successful with weaning. Had been treated for aspiration PNA, ARDS, improved.  Was discharged to [facility].   Chart review says hydrocortisone 186m Daily, but not on daughter's med list.    D[facility]    Past Medical History  ALS HTN GERD Depression Decubitus ulcer  Significant Hospital Events      Consults:  Wound care  Procedures:    Significant Diagnostic Tests:  CXR:   Micro Data:  Blood cultures 10/27 >>  Sputum cultures 10/28 >> staph aureus, susceptibilities pending BAL 10/29 >>  Antimicrobials:  10/27 -  Cefepime >> 10/27 - Vanc >>> 10/29 10/27 - flagyl >> 10/29  Interim history/subjective:   She complains of shortness of breath and increased secretions needing to be suctioned.  No chest pain.  [facility]   In speaking with her daughter MLeafy Ro who is an RT, [place] Prior to admission in September she could still walk with a walker (had just started needing in July 2020), needing her vent only at night. Had most predominant bulbar symptoms at baseline- hasn't been able to talk in about 1 year. Was using cough assist at home prior to trach (doing it herself). She was participating in most of her own self  care prior to her Sept hospitalizations- dressed herself, was able to do her own cough assist and put herself on the vent.     Objective   Blood pressure 115/68, pulse 60, temperature 98.6 F (37 C), resp. rate 13, height _0  (1.651 m), weight 57.8 kg, SpO2 97 %.    Vent Mode: PRVC FiO2 (%):  [35 %-50 %] 35 % Set Rate:  [14 bmp] 14 bmp Vt Set:  [400 mL-500 mL] 500 mL PEEP:  [8 cmH20] 8 cmH20 Plateau Pressure:  [20 cmH20-23 cmH20] 23 cmH20   Intake/Output Summary (Last 24 hours) at 10/06/2019 1209 Last data filed at 10/06/2019 1100 Gross per 24 hour  Intake 2678.58 ml  Output 4410 ml  Net -1731.42 ml   Filed Weights   10/04/19 0500 10/05/19 0318 10/06/19 0500  Weight: 60.2 kg 59.8 kg 57.8 kg    Examination: General: Chronically ill-appearing woman lying in bed no acute distress HENT: [place]/AT, eyes anicteric Neck: Trach in place, no throat erythema or drainage Lungs: Bilateral rhonchi, somewhat improved with suctioning tan secretions.  Breathing comfortably above the set rate on the vent. Cardiovascular: Regular rate and rhythm, no murmurs Abdomen: In place, nontender Extremities: No edema, muscle wasting in upper extremities Neuro: Interactive, answering questions with nodding.  Attempts to talk, but has muscle weakness.  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 Trached late Sept at [place]. Bilateral VAP at admission from [place]. Previous bronchoscopy performed with removal of thick inspissated mucoid secretions from both lower lobes, BAL sent -CoughAssist ordered; discussed with respiratory that I would like this done twice daily which should be continued after discharge -Continue CPT and hypertonic saline which seems to be improving clearance of secretions -Hold on vent weaning trials today due to mucus  clearance issues  Acute on chronic weakness due to ALS: s/p tracheostomy, now chronically ventilated 24/7.    -Continue vent support -PT and OT consulted -Need to determine if her ALS medications are available for [facility]-her daughter has been working on this  Sepsis due to bilateral LL pneumonia;  Shock resolved. Staph aureus pneumonia. -Continue ceftriaxone to complete a 7-day course of antibiotics; last dose 11/2  AKI -resolved -Continue to monitor  Protein calorie malnutrition, mild -Continue tube feeds  Chronic glucocorticoid use-on IV hydrocortisone for unclear reason, apparently she was discharged on this from [place] and this was continued at [place] , discussed with daughter -Continue prednisone 15 mg daily and slowly decrease over the next 1 to 2 weeks   Best practice:  Diet: npo, tube feeds Pain/Anxiety/Delirium protocol (if indicated): fent prn VAP protocol (if indicated):  DVT prophylaxis: lovenox daily GI prophylaxis: protonix Glucose control: SSI Mobility: bedrest Code Status: Full Family Communication: Daughter , Leafy Ro 10/30 Disposition: ICU  This patient is critically ill with multiple organ system failure which requires frequent high complexity decision making, assessment, support, evaluation, and titration of therapies. This was completed through the application of advanced monitoring technologies and extensive interpretation of multiple databases. During this encounter critical care time was devoted to patient care services described in this note for 30 minutes.  Julian Hy, DO 10/06/19 12:09 PM [facility]

## 2019-10-07 LAB — GLUCOSE, CAPILLARY
Glucose-Capillary: 103 mg/dL — ABNORMAL HIGH (ref 70–99)
Glucose-Capillary: 124 mg/dL — ABNORMAL HIGH (ref 70–99)
Glucose-Capillary: 128 mg/dL — ABNORMAL HIGH (ref 70–99)
Glucose-Capillary: 149 mg/dL — ABNORMAL HIGH (ref 70–99)
Glucose-Capillary: 166 mg/dL — ABNORMAL HIGH (ref 70–99)
Glucose-Capillary: 187 mg/dL — ABNORMAL HIGH (ref 70–99)
Glucose-Capillary: 99 mg/dL (ref 70–99)

## 2019-10-07 LAB — BASIC METABOLIC PANEL
Anion gap: 6 (ref 5–15)
BUN: 13 mg/dL (ref 8–23)
CO2: 29 mmol/L (ref 22–32)
Calcium: 8.8 mg/dL — ABNORMAL LOW (ref 8.9–10.3)
Chloride: 102 mmol/L (ref 98–111)
Creatinine, Ser: <0.3 mg/dL — ABNORMAL LOW (ref 0.44–1.00)
Glucose, Bld: 114 mg/dL — ABNORMAL HIGH (ref 70–99)
Potassium: 4.5 mmol/L (ref 3.5–5.1)
Sodium: 137 mmol/L (ref 135–145)

## 2019-10-07 LAB — CBC
HCT: 26.2 % — ABNORMAL LOW (ref 36.0–46.0)
Hemoglobin: 8.3 g/dL — ABNORMAL LOW (ref 12.0–15.0)
MCH: 29 pg (ref 26.0–34.0)
MCHC: 31.7 g/dL (ref 30.0–36.0)
MCV: 91.6 fL (ref 80.0–100.0)
Platelets: 283 10*3/uL (ref 150–400)
RBC: 2.86 MIL/uL — ABNORMAL LOW (ref 3.87–5.11)
RDW: 13.3 % (ref 11.5–15.5)
WBC: 6.6 10*3/uL (ref 4.0–10.5)
nRBC: 0.3 % — ABNORMAL HIGH (ref 0.0–0.2)

## 2019-10-07 MED ORDER — PREDNISONE 5 MG/5ML PO SOLN
15.0000 mg | Freq: Every day | ORAL | Status: AC
  Administered 2019-10-08 – 2019-10-12 (×5): 15 mg
  Filled 2019-10-07 (×5): qty 15

## 2019-10-07 MED ORDER — PREDNISONE 5 MG/5ML PO SOLN: 5.0000 mg | Freq: Every day | ORAL | Status: DC

## 2019-10-07 MED ORDER — PREDNISONE 5 MG/5ML PO SOLN: 15.0000 mg | Freq: Every day | ORAL | Status: DC

## 2019-10-07 MED ORDER — PREDNISONE 5 MG/5ML PO SOLN: 10.0000 mg | Freq: Every day | ORAL | Status: DC

## 2019-10-07 MED ORDER — FAMOTIDINE 40 MG/5ML PO SUSR
20.0000 mg | Freq: Two times a day (BID) | ORAL | Status: DC
  Administered 2019-10-07 – 2019-10-12 (×11): 20 mg via ORAL
  Filled 2019-10-07 (×11): qty 2.5

## 2019-10-07 MED ORDER — SODIUM CHLORIDE 3 % IN NEBU
4.0000 mL | INHALATION_SOLUTION | RESPIRATORY_TRACT | Status: DC | PRN
  Filled 2019-10-07: qty 4

## 2019-10-07 MED ORDER — ALBUTEROL SULFATE (2.5 MG/3ML) 0.083% IN NEBU
2.5000 mg | INHALATION_SOLUTION | RESPIRATORY_TRACT | Status: DC | PRN
  Administered 2019-10-10 – 2019-10-12 (×12): 2.5 mg via RESPIRATORY_TRACT
  Filled 2019-10-07 (×10): qty 3

## 2019-10-07 MED ORDER — LORAZEPAM 2 MG/ML IJ SOLN
1.0000 mg | Freq: Once | INTRAMUSCULAR | Status: AC
  Administered 2019-10-07: 1 mg via INTRAVENOUS
  Filled 2019-10-07: qty 1

## 2019-10-07 NOTE — Progress Notes (Signed)
CSW never received return call from Santiago Glad at [facility] regarding this patient's potential admission into the facility. CSW reached back out to Santiago Glad without success, a voicemail was left requesting a return call. CSW reached out to clinical liasion for [facility], [place] awaiting return call.  Madilyn Fireman, MSW, LCSW-A Transitions of Care  Clinical Social Worker  [facility] Emergency Departments  Medical ICU [phone]

## 2019-10-07 NOTE — Progress Notes (Signed)
OT Cancellation Note  Patient Details Name: Emily Moody MRN: [id number] DOB: 01-Oct-1951   Cancelled Treatment:    Reason Eval/Treat Not Completed: Fatigue/lethargy limiting ability to participate.  Pt lethargic and indicates she didn't sleep last night.  When provided with choices she asked that OT return tomorrow and defer today.  She had difficulty maintain arousal during conversation.  Lucille Passy, OTR/L [place] Pager [phone] Office [phone]   Lucille Passy M 10/07/2019, 3:25 PM

## 2019-10-07 NOTE — Progress Notes (Addendum)
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    History of present illness   Admitted from [place] for increased SOB, increasing pulmonary infiltrate on CXR.  Was requiring higher than usual fio2 (60%) and higher peak pressures.   Increased pressures, and increased FIO2 needs. Hypotensive to 60s on arrival   Recent note from Pulmonology NP 09/22/19 at [facility]: on full vent support, FIO2 40% Rate 18, TV 400 PEEP 5.  They had not been successful with weaning. Had been treated for aspiration PNA, ARDS, improved.  Was discharged to [facility].   Chart review says hydrocortisone 144m Daily, but not on daughter's med list.    [facility]    Past Medical History  ALS HTN GERD Depression Decubitus ulcer  Significant Hospital Events   [place]  Consults:  Wound care  Procedures:    Significant Diagnostic Tests:  CXR:   Micro Data:  Blood cultures 10/27 >>  Sputum cultures 10/28 >> staph aureus, susceptibilities pending BAL 10/29 >>  Antimicrobials:  Cefazolin - 10/30-11/2 (last day 11/2) Cefepime - 10/28-10/30 Vanc -10/27-10/29 flagyl-10/27-10/29  Interim history/subjective:  No actue events overnight.  Overall, Ms. LLoperfeels improved admission though not entirely back to baseline yet.  No new complaints this morning.  She continues to note pain in her legs and hips which he believes is related to her decubitus ulcer.   Objective   Blood pressure 97/63, pulse 70, temperature 97.9 F (36.6 C), resp. rate 12, height _0  (1.651 m), weight 59.8 kg, SpO2 99 %.    Vent Mode: PRVC FiO2 (%):  [35 %] 35 % Set Rate:  [12 bmp-14 bmp] 12 bmp Vt Set:  [500 mL] 500 mL PEEP:   [8 cmH20] 8 cmH20 Plateau Pressure:  [22 cmH20-23 cmH20] 22 cmH20   Intake/Output Summary (Last 24 hours) at 10/07/2019 0751 Last data filed at 10/07/2019 0600 Gross per 24 hour  Intake 2842.6 ml  Output 2535 ml  Net 307.6 ml   Filed Weights   10/05/19 0318 10/06/19 0500 10/07/19 0500  Weight: 59.8 kg 57.8 kg 59.8 kg    Examination: General: Awake and alert.  Interactive.  Chronically ill-appearing. HEENT: Dry mucous membranes.  No appreciable JVD. Cardio: Normal S1 and S2, no S3 or S4.  Rhythm is regular.  1/6 systolic murmur at cardiac apex. Pulm: Trach and vent dependent.  No rales, wheezing, stridor appreciated on lateral auscultation. Abdomen: Bowel sounds normal. Abdomen soft and non-tender.  Extremities: No peripheral edema. Warm/ well perfused.  Strong radial pulse. Neuro: Cranial nerves grossly intact  Resolved Hospital Problem list     Assessment & Plan:   Sepsis due to bilateral LL pneumonia; improved Shock resolved. Staph aureus pneumonia and E. coli, both susceptible to Ancef. 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    -Continue vent support -PT and OT consulted -Need to determine if her ALS medications are available for [facility]-her daughter has been working on this  AKI -resolved -Continue to monitor  Protein calorie malnutrition, mild  -Continue tube feeds  Chronic glucocorticoid use-on IV hydrocortisone for unclear reason, apparently she was discharged on this from [place] and  this was continued at [place] , discussed with daughter -Continue prednisone 15 mg daily and slowly decrease over the next 1 to 2 weeks  Dispo: now medically, back to baseline.  Will begin to organize transition to [place].   Best practice:  Diet: npo, tube feeds Pain/Anxiety/Delirium protocol (if indicated): fent prn VAP protocol (if indicated):  DVT prophylaxis: lovenox daily GI prophylaxis: Famotidine Glucose control: SSI Mobility: bedrest Code Status: Full Family Communication: Daughter , Leafy Ro 10/30 Disposition: ICU  This patient is critically ill with multiple organ system failure which requires frequent high complexity decision making, assessment, support, evaluation, and titration of therapies. This was completed through the application of advanced monitoring technologies and extensive interpretation of multiple databases. During this encounter critical care time was devoted to patient care services described in this note for 30 minutes.  Matilde Haymaker, MD  PGY-2 10/07/19 7:51 AM [facility]

## 2019-10-08 ENCOUNTER — Inpatient Hospital Stay (HOSPITAL_COMMUNITY): Payer: Medicare Other

## 2019-10-08 DIAGNOSIS — Z978 Presence of other specified devices: Secondary | ICD-10-CM

## 2019-10-08 LAB — BASIC METABOLIC PANEL
Anion gap: 10 (ref 5–15)
BUN: 9 mg/dL (ref 8–23)
CO2: 29 mmol/L (ref 22–32)
Calcium: 9.1 mg/dL (ref 8.9–10.3)
Chloride: 98 mmol/L (ref 98–111)
Creatinine, Ser: <0.3 mg/dL — ABNORMAL LOW (ref 0.44–1.00)
Glucose, Bld: 123 mg/dL — ABNORMAL HIGH (ref 70–99)
Potassium: 4.1 mmol/L (ref 3.5–5.1)
Sodium: 137 mmol/L (ref 135–145)

## 2019-10-08 LAB — GLUCOSE, CAPILLARY
Glucose-Capillary: 110 mg/dL — ABNORMAL HIGH (ref 70–99)
Glucose-Capillary: 113 mg/dL — ABNORMAL HIGH (ref 70–99)
Glucose-Capillary: 152 mg/dL — ABNORMAL HIGH (ref 70–99)
Glucose-Capillary: 168 mg/dL — ABNORMAL HIGH (ref 70–99)
Glucose-Capillary: 123 mg/dL — ABNORMAL HIGH (ref 70–99)

## 2019-10-08 MED ORDER — PNEUMOCOCCAL VAC POLYVALENT 25 MCG/0.5ML IJ INJ: 0.5000 mL | INJECTION | INTRAMUSCULAR | Status: DC

## 2019-10-08 MED ORDER — INFLUENZA VAC A&B SA ADJ QUAD 0.5 ML IM PRSY
0.5000 mL | PREFILLED_SYRINGE | INTRAMUSCULAR | Status: DC
  Filled 2019-10-08 (×2): qty 0.5

## 2019-10-08 NOTE — Progress Notes (Signed)
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 Admitted from [facility] for increased SOB, increasing pulmonary infiltrate on CXR. Per daughter (an RT at [facility]), she had her trach in late Sept at [place]. Prior to admission in September she could still walk with a walker (had just started needing in July 2020), needing her vent only at night, hasn't been able to talk in about 1 year.     PT Comments    PT agreeable to therapy, requesting to use bed pan before transferring to chair. Pt is limited in safe mobility by pain and generalized weakness and decreased endurance. Pt min A for rolling R and L for bed pan placement. Pt requiring maxA for transferring to EoB and total A to pivot to chair. D/c plans remain appropriate at this time. PT will continue to follow acutely.    Follow Up Recommendations  LTACH;SNF;Supervision/Assistance - 24 hour     Equipment Recommendations  None recommended by PT       Precautions / Restrictions Precautions Precautions: Fall;Other (comment) Precaution Comments: multiple lines, tubes Restrictions Weight Bearing Restrictions: No    Mobility  Bed Mobility Overal bed mobility: Needs Assistance Bed Mobility: Rolling;Sidelying to Sit;Sit to Sidelying Rolling: Min assist Sidelying to sit: Max assist       General bed mobility comments: Min A for rolling during toileting at bed level. Max A to elevate trunk into upright posture  Transfers Overall transfer level: Needs assistance Equipment used: 1 person hand held assist(bed pad under pelvis) Transfers: Stand Pivot Transfers   Stand pivot transfers: Total assist       General transfer comment: Total A pivot to recliner      Balance Overall balance assessment: Needs  assistance Sitting-balance support: Bilateral upper extremity supported;Feet unsupported Sitting balance-Leahy Scale: Poor Sitting balance - Comments: Min A for right lateral lean Postural control: Other (comment)(anterior lean) Standing balance support: During functional activity Standing balance-Leahy Scale: Zero Standing balance comment: Total A for transfer                            Cognition Arousal/Alertness: Lethargic;Suspect due to medications Behavior During Therapy: Flat affect Overall Cognitive Status: Difficult to assess                                 General Comments: following 1 step commands with some delay         General Comments General comments (skin integrity, edema, etc.): on Vent O2 concentaration 40% and PEEP 8. RR 24-30. SpO2 93%, BP in supine 143/82, after transfer to chair 169/133, with 5 minutes seated and adminstration of pain medication BP 146/91      Pertinent Vitals/Pain Pain Assessment: Faces Faces Pain Scale: Hurts even more Pain Location: points to her chest Pain Descriptors / Indicators: Grimacing;Guarding Pain Intervention(s): Limited activity within patient's tolerance;Monitored during session;Repositioned    Home Living Family/patient expects to be discharged to:: Other (Comment)               Additional Comments: per chart review, [facility]     Prior Function Level of Independence: Needs assistance  Gait / Transfers Assistance Needed: unclear her mobility level on admission from [facility]; before  was hospitalized in Sept, she was walking with RW and using vent at night ADL's / Homemaking Assistance Needed: Requiring assistance for BADLs Comments: Vent dependent   PT Goals (current goals can now be found in the care plan section) Acute Rehab PT Goals Patient Stated Goal: pt unable to state; gave thumbs up to getting stronger PT Goal Formulation: Patient unable to participate in goal setting Time For Goal  Achievement: 10/19/19 Potential to Achieve Goals: Fair Progress towards PT goals: Progressing toward goals    Frequency    Min 2X/week      PT Plan Current plan remains appropriate    Co-evaluation PT/OT/SLP Co-Evaluation/Treatment: Yes Reason for Co-Treatment: Complexity of the patient's impairments (multi-system involvement);For patient/therapist safety PT goals addressed during session: Mobility/safety with mobility OT goals addressed during session: ADL's and self-care      AM-PAC PT "6 Clicks" Mobility   Outcome Measure  Help needed turning from your back to your side while in a flat bed without using bedrails?: A Little Help needed moving from lying on your back to sitting on the side of a flat bed without using bedrails?: A Lot Help needed moving to and from a bed to a chair (including a wheelchair)?: Total Help needed standing up from a chair using your arms (e.g., wheelchair or bedside chair)?: Total Help needed to walk in hospital room?: Total Help needed climbing 3-5 steps with a railing? : Total 6 Click Score: 9    End of Session Equipment Utilized During Treatment: Oxygen Activity Tolerance: Patient limited by fatigue Patient left: in bed;with call bell/phone within reach Nurse Communication: Mobility status;Other (comment)(dangle EOB) PT Visit Diagnosis: Muscle weakness (generalized) (M62.81);Difficulty in walking, not elsewhere classified (R26.2);Other symptoms and signs involving the nervous system (A45.364)     Time: 1251-1330 PT Time Calculation (min) (ACUTE ONLY): 39 min  Charges:  $Therapeutic Activity: 8-22 mins                     Daisi Kentner B. Migdalia Dk PT, DPT Acute Rehabilitation Services Pager [phone] Office [phone]    [place] 10/08/2019, 4:35 PM

## 2019-10-08 NOTE — Progress Notes (Signed)
Patient continues to have a lot of anxiety and restlessness throughout shift. Pain appears to be controlled by tramadol and fentanyl combination. She continues to not sleep well. Maintained saturations above 95% during titration of FiO2. Currently at 60% FiO2. Will continue to titrate today as indicated and continue to monitor.

## 2019-10-08 NOTE — Progress Notes (Signed)
[facility] Progress Note Patient Name: Emily Moody DOB: 1951/06/11 MRN: [id number]   Date of Service  10/08/2019  HPI/Events of Note  Hypoxia - increasing O2 requirements. Request for CXR in AM.   eICU Interventions  Will order: 1. Portable CXR in AM.     Intervention Category Major Interventions: Hypoxemia - evaluation and management  Edwin Baines [place] 10/08/2019, 2:18 AM

## 2019-10-08 NOTE — Progress Notes (Signed)
PROGRESS NOTE    Emily Moody  OZY:[id number] DOB: 1951/06/29 DOA: 10/01/2019 PCP: [name], MD    Brief Narrative:  68 year old man with ALS who presented with a left lower lobe ventilator acquired pneumonia. She is chronically ventilator dependent. She has completed a course of IV cefazolin for MSSA in her sputum. Now pending placement.   Assessment & Plan:  Bilateral LL pneumonia; improved Shock resolved. Staph aureus pneumonia and E. coli, both susceptible to Ancef. -completed cefazolin 7-day course of antibiotics; last dose was 11/2  Acute on chronic hypoxemic vent-dependent respiratory failure- improving Trached late Sept at [place] ordered; discussed with respiratory that I would like this done twice daily which should be continued after discharge -Continue CPT and hypertonic saline which seems to be improving clearance of secretions -now on baseline vent settings  Acute on chronic weakness due to ALS: s/p tracheostomy, now chronically ventilated 24/7.    -Continue vent support -PT and OT consulted -Need to determine if her ALS medications are available for [facility]-her daughter has been working on this  Protein calorie malnutrition, mild  -Continue tube feeds  Chronic glucocorticoid use-on IV hydrocortisone for unclear reason, apparently she was discharged on this from [place] and this was continued at [place] , discussed with daughter -Continue prednisone 15 mg daily and slowly taper over the next 1 to 2 weeks (ordered)  AKI -resolved -Continue to monitor   DVT prophylaxis: Lovenox SQ Code Status:Full code   Consultants:   PCCM  Procedures:   n/a  Antimicrobials:  Completed 7 day course of IV cefazoline    Subjective: Non-verbal but nods her heads and uses her hands for communication. Denies SOB but endorses weakness. No fevers or chills. Denies n/v/d/c.  Objective: Vitals:   10/08/19 0500 10/08/19 0600 10/08/19 0700  10/08/19 0830  BP: (!) 142/75 (!) 145/78  128/68  Pulse: 62 70  64  Resp: 14 13  (!) 23  Temp:   98.4 F (36.9 C)   TempSrc:   Oral   SpO2: 100% 100%  100%  Weight: 60.1 kg     Height:        Intake/Output Summary (Last 24 hours) at 10/08/2019 0922 Last data filed at 10/08/2019 0600 Gross per 24 hour  Intake 1340.06 ml  Output 1180 ml  Net 160.06 ml   Filed Weights   10/06/19 0500 10/07/19 0500 10/08/19 0500  Weight: 57.8 kg 59.8 kg 60.1 kg    Examination:  General exam: Appears calm and comfortable.  Chronically ill and cachectic appearing Respiratory system: Coarse breath sounds throughout. Respiratory effort normal.  Status post tracheostomy on ventilator Cardiovascular system: S1 & S2 heard, RRR. No JVD, murmurs, rubs, gallops or clicks. No pedal edema. Gastrointestinal system: Abdomen is nondistended, soft and nontender. No organomegaly or masses felt. Normal bowel sounds heard.  PEG tube in place with dressing clean dry and intact Central nervous system: Alert and oriented.  Nonverbal but able to follow commands Extremities: Evidence of muscle wasting diffusely Skin: No rashes, lesions or ulcers    Scheduled Meds: . busPIRone  5 mg Per Tube BID  . chlorhexidine gluconate (MEDLINE KIT)  15 mL Mouth Rinse BID  . Chlorhexidine Gluconate Cloth  6 each Topical Daily  . clonazePAM  1 mg Per Tube Q8H  . collagenase   Topical Daily  . enoxaparin (LOVENOX) injection  40 mg Subcutaneous Daily  . famotidine  20 mg Oral BID  . feeding supplement (PRO-STAT SUGAR  FREE 64)  30 mL Per Tube BID  . insulin aspart  0-9 Units Subcutaneous Q4H  . mouth rinse  15 mL Mouth Rinse 10 times per day  . multivitamin with minerals  1 tablet Per Tube Daily  . predniSONE  15 mg Per Tube Q breakfast   Followed by  . [START ON 10/13/2019] predniSONE  10 mg Per Tube Q breakfast   Followed by  . [START ON 10/20/2019] predniSONE  5 mg Per Tube Q breakfast  . riluzole  50 mg Per Tube Q12H  .  sertraline  150 mg Per Tube Daily   Continuous Infusions: . feeding supplement (JEVITY 1.2 CAL) 50 mL/hr at 10/08/19 0617     LOS: 6 days    Time spent: 35 mins  Charolotte Capuchin, MD [facility] Pager [phone]  If 7PM-7AM, please contact night-coverage [email or web address] Password [email or web address] 10/08/2019, 9:22 AM

## 2019-10-08 NOTE — Evaluation (Signed)
Occupational Therapy Evaluation Patient Details Name: Emily Moody MRN: [id number] DOB: [birth year] Today's Date: 10/08/2019    History of Present Illness 69 year old woman with ALS, HTN, GERD, depression, here with worsening dyspnea, hypotension, LLL pna, sepsis. Admitted from [place] for increased SOB, increasing pulmonary infiltrate on CXR. Per daughter (an RT at [facility]), she had her trach in late Sept at [place]. Prior to admission in September she could still walk with a walker (had just started needing in July 2020), needing her vent only at night, hasn't been able to talk in about 1 year.    Clinical Impression   Per chart review, pt was living at [facility] and required assistance with BADLs. Pt currently requiring Max A for ADLs and Total A for functional transfers. Pt agreeable to perform OOB activity and sit in recliner; pt using white board for communication. VSS throughout and BP stable. Pt would benefit from further acute OT to facilitate safe dc. Recommend dc to LTACH with follow up OT to optimize safety, independence with ADLs, and return to PLOF.      Follow Up Recommendations  LTACH;SNF;Supervision/Assistance - 24 hour    Equipment Recommendations  Other (comment)(Defer to next venue)    Recommendations for Other Services PT consult     Precautions / Restrictions Precautions Precautions: Fall;Other (comment) Precaution Comments: multiple lines, tubes Restrictions Weight Bearing Restrictions: No      Mobility Bed Mobility Overal bed mobility: Needs Assistance Bed Mobility: Rolling;Sidelying to Sit;Sit to Sidelying Rolling: Min assist Sidelying to sit: Max assist       General bed mobility comments: Min A for rolling during toileting at bed level. Max A to elevate trunk into upright posture  Transfers Overall transfer level: Needs assistance Equipment used: 1 person hand held assist(bed pad under pelvis) Transfers: Stand Pivot Transfers   Stand pivot  transfers: Total assist       General transfer comment: Total A pivot to recliner    Balance Overall balance assessment: Needs assistance Sitting-balance support: Bilateral upper extremity supported;Feet unsupported Sitting balance-Leahy Scale: Poor Sitting balance - Comments: Min A for right lateral lean Postural control: Other (comment)(anterior lean) Standing balance support: During functional activity Standing balance-Leahy Scale: Zero Standing balance comment: Total A for transfer                           ADL either performed or assessed with clinical judgement   ADL Overall ADL's : Needs assistance/impaired Eating/Feeding: NPO   Grooming: Minimal assistance;Sitting;Bed level   Upper Body Bathing: Maximal assistance;Sitting;Bed level   Lower Body Bathing: Maximal assistance;Bed level   Upper Body Dressing : Maximal assistance;Sitting;Bed level   Lower Body Dressing: Maximal assistance;Bed level   Toilet Transfer: Total assistance;Squat-pivot(simulated to recliner)   Toileting- Clothing Manipulation and Hygiene: Maximal assistance;Bed level       Functional mobility during ADLs: Total assistance;+2 for safety/equipment(squat pivot) General ADL Comments: Pt presenting with decreased activity tolerance, strength, and balance. Pt moivated to participate in OOB activity     Vision         Perception     Praxis      Pertinent Vitals/Pain Pain Assessment: Faces Faces Pain Scale: Hurts even more Pain Location: points to her chest Pain Descriptors / Indicators: Grimacing;Guarding Pain Intervention(s): Monitored during session;Limited activity within patient's tolerance;Repositioned     Hand Dominance (Using left as right hand as deficits)   Extremity/Trunk Assessment Upper Extremity Assessment Upper Extremity  Assessment: Generalized weakness;RUE deficits/detail RUE Deficits / Details: Decreased finen and gross motor coordination. Limited digit  ROM and poor grasp strength RUE Coordination: decreased gross motor;decreased fine motor   Lower Extremity Assessment Lower Extremity Assessment: Defer to PT evaluation RLE Deficits / Details: AAROM WFL except ankle dorsiflexion -15 from neutral; hip flexion 3+ (supine), knee extension 3+ (supine), knee flexion 2+, ankle DF 3+ LLE Deficits / Details: AAROM WFL; hip flexion 2+, knee extension 3+, knee flexion 2+, ankle DF 2+   Cervical / Trunk Assessment Cervical / Trunk Assessment: Kyphotic   Communication Communication Communication: Tracheostomy;Other (comment)(Using a white board to write on. On vent)   Cognition Arousal/Alertness: Lethargic;Suspect due to medications Behavior During Therapy: Flat affect Overall Cognitive Status: Difficult to assess                                 General Comments: following 1 step commands with some delay   General Comments  on Vent O2 concentaration 40% and PEEP 8. RR 24-30. SpO2 93%    Exercises     Shoulder Instructions      Home Living Family/patient expects to be discharged to:: Other (Comment)                                 Additional Comments[facility]       Prior Functioning/Environment Level of Independence: Needs assistance  Gait / Transfers Assistance Needed: unclear her mobility level on admission from [facility]; before was hospitalized in Sept, she was walking with RW and using vent at night ADL's / Homemaking Assistance Needed: Requiring assistance for BADLs Communication / Swallowing Assistance Needed: non-verbal x 1 yr Comments: Vent dependent        OT Problem List: Decreased strength;Decreased range of motion;Decreased activity tolerance;Impaired balance (sitting and/or standing);Decreased knowledge of use of DME or AE;Decreased knowledge of precautions;Impaired UE functional use      OT Treatment/Interventions: Self-care/ADL training;Therapeutic exercise;Energy  conservation;DME and/or AE instruction    OT Goals(Current goals can be found in the care plan section) Acute Rehab OT Goals Patient Stated Goal: Agreeable to OOB to chair OT Goal Formulation: With patient Time For Goal Achievement: 10/22/19 Potential to Achieve Goals: Good  OT Frequency: Min 2X/week   Barriers to D/C:            Co-evaluation PT/OT/SLP Co-Evaluation/Treatment: Yes Reason for Co-Treatment: Complexity of the patient's impairments (multi-system involvement);For patient/therapist safety;To address functional/ADL transfers   OT goals addressed during session: ADL's and self-care      AM-PAC OT "6 Clicks" Daily Activity     Outcome Measure Help from another person eating meals?: Total Help from another person taking care of personal grooming?: A Little Help from another person toileting, which includes using toliet, bedpan, or urinal?: Total Help from another person bathing (including washing, rinsing, drying)?: A Lot Help from another person to put on and taking off regular upper body clothing?: A Lot Help from another person to put on and taking off regular lower body clothing?: Total 6 Click Score: 10   End of Session Equipment Utilized During Treatment: Other (comment)(Vent) Nurse Communication: Mobility status  Activity Tolerance: Patient limited by fatigue Patient left: in chair;with call bell/phone within reach  OT Visit Diagnosis: Unsteadiness on feet (R26.81);Other abnormalities of gait and mobility (R26.89);Muscle weakness (generalized) (M62.81);Pain Pain - part of  body: (Chest)                Time: 1251-1330 OT Time Calculation (min): 39 min Charges:  OT General Charges $OT Visit: 1 Visit OT Evaluation $OT Eval Moderate Complexity: 1 Mod OT Treatments $Self Care/Home Management : 8-22 mins  Deyanira Fesler MSOT, OTR/L Acute Rehab Pager: [phone] Office: [phone]  Theodoro Grist Jayvian Escoe 10/08/2019, 3:31 PM

## 2019-10-09 DIAGNOSIS — Z515 Encounter for palliative care: Secondary | ICD-10-CM

## 2019-10-09 DIAGNOSIS — F411 Generalized anxiety disorder: Secondary | ICD-10-CM

## 2019-10-09 DIAGNOSIS — Z7189 Other specified counseling: Secondary | ICD-10-CM

## 2019-10-09 LAB — GLUCOSE, CAPILLARY
Glucose-Capillary: 113 mg/dL — ABNORMAL HIGH (ref 70–99)
Glucose-Capillary: 115 mg/dL — ABNORMAL HIGH (ref 70–99)
Glucose-Capillary: 129 mg/dL — ABNORMAL HIGH (ref 70–99)
Glucose-Capillary: 160 mg/dL — ABNORMAL HIGH (ref 70–99)
Glucose-Capillary: 82 mg/dL (ref 70–99)
Glucose-Capillary: 83 mg/dL (ref 70–99)
Glucose-Capillary: 99 mg/dL (ref 70–99)

## 2019-10-09 LAB — BASIC METABOLIC PANEL
Anion gap: 9 (ref 5–15)
BUN: 13 mg/dL (ref 8–23)
CO2: 28 mmol/L (ref 22–32)
Calcium: 9.1 mg/dL (ref 8.9–10.3)
Chloride: 101 mmol/L (ref 98–111)
Creatinine, Ser: <0.3 mg/dL — ABNORMAL LOW (ref 0.44–1.00)
Glucose, Bld: 116 mg/dL — ABNORMAL HIGH (ref 70–99)
Potassium: 4.2 mmol/L (ref 3.5–5.1)
Sodium: 138 mmol/L (ref 135–145)

## 2019-10-09 MED ORDER — OXYCODONE HCL 5 MG/5ML PO SOLN
5.0000 mg | Freq: Four times a day (QID) | ORAL | Status: DC | PRN
  Administered 2019-10-09 – 2019-10-12 (×6): 5 mg
  Filled 2019-10-09 (×7): qty 5

## 2019-10-09 MED ORDER — CLONAZEPAM 1 MG PO TABS
2.0000 mg | ORAL_TABLET | Freq: Every day | ORAL | Status: DC
  Administered 2019-10-09 – 2019-10-11 (×3): 2 mg
  Filled 2019-10-09 (×3): qty 2

## 2019-10-09 MED ORDER — CLONAZEPAM 1 MG PO TABS
1.0000 mg | ORAL_TABLET | Freq: Two times a day (BID) | ORAL | Status: DC
  Administered 2019-10-09 – 2019-10-12 (×6): 1 mg
  Filled 2019-10-09 (×6): qty 1

## 2019-10-09 MED ORDER — OXYCODONE HCL 5 MG PO TABS: 5.0000 mg | ORAL_TABLET | Freq: Four times a day (QID) | ORAL | Status: DC | PRN

## 2019-10-09 NOTE — Progress Notes (Signed)
Palliative:  HPI: 68 yo female with PMH ALS, HTN, GERD, decubitus ulcer, depression, s/p tracheostomy/PEG admitted from [facility] 10/01/19 with LLL pneumonia and worsening dyspnea and hypotension. Completed antibiotic course for MSSA sputum.   I spoke this morning with daughter, Emily Moody. Emily Moody is Emily Moody's only child and is a respiratory therapist. Emily Moody tells me right off that she did not agree with her mother's decision for tracheostomy as she does not wish to see her mother suffer. Emily Moody tells me that she has been caring for her mother at home until her recent decline when she could no longer manage her care (she works and has a young child). Emily Moody has had multiple frank conversations with her mother prior to tracheostomy about what this would look like and mean to her QOL. She has explained expectations and that she would not be able to care for her at home. She respects her mother's decisions even though they are not what she would choose for herself or her loved ones.   Emily Moody has since tried to speak with her mother regarding DNR and tells me "it makes me sick to my stomach to think of my mother having CPR." However, she believes her mother is in denial of how critical her illness is and that she greatly fears death. Emily Moody shares that Emily Moody has always had severe anxiety even prior to ALS diagnosis and this has only worsened. Emily Moody understands that I will attempt to engage with her mother to continue these conversations and support. From Emily Moody's explanation Emily Moody has been going through stages of grief especially with denial and anger.   I spoke separately to Emily Moody. She is friendly and able to communicate via a board and is able to write to me. I begin by explaining that I understand that her condition is frightening and I am sorry she has to go through this. I reflected that her situation is unfair and no reason why she is dealing with this over someone else. I explained my role of palliative care to support her through  her illness and assist with trying to optimize her QOL as much as possible (recognizing there are extreme barriers here). I discussed symptom management and she complains of pain from her pressure ulcer and severe anxiety leading to insomnia. We discussed adjustment of medications to better control these symptoms and she is appreciative.   All questions/concerns addressed. Emotional support provided.   Exam: Alert. Communicates via writing. Able to move head and upper extremities. Trach to vent. Requiring increasing vent support support. Generalized weakness especially in lower extremities.  Plan: - Continue [place] conversation. My goal today was to begin to build rapport.  - Pain: Oxycodone 5 mg every 6 hours prn breakthrough pain not relieved by tramadol.  - Anxiety: Continue clonazepam every 8 hours but will increase qhs dose from 1 mg to 2 mg.   2 min  Emily Sill, NP Palliative Medicine Team Pager [phone] (Please see [email or web address] for schedule) Team Phone [phone]    Greater than 50%  of this time was spent counseling and coordinating care related to the above assessment and plan

## 2019-10-09 NOTE — Progress Notes (Signed)
CSW spoke with Santiago Glad at [facility] to obtain an update about the referral that was made for this patient. Santiago Glad reports there is a team meeting at [place] to discuss this patient. CSW will wait for a return call.  Madilyn Fireman, MSW, LCSW-A Transitions of Care  Clinical Social Worker  [facility] Emergency Departments  Medical ICU [phone]

## 2019-10-09 NOTE — Progress Notes (Signed)
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 She is chronically ventilator dependent. She has completed a course of IV cefazolin for MSSA in her sputum. [facility]   Assessment & Plan:  Bilateral LL pneumonia; resolved Shock resolved. Staph aureus pneumonia and E. coli, both susceptible to Ancef. 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    -Continue vent support -PT and OT consulted; recommend LTACH -Need to determine if her ALS medications are available for [facility]-her daughter has been working on this  Protein calorie malnutrition, mild  -Continue tube feeds  Chronic glucocorticoid use-on IV hydrocortisone for unclear reason, apparently she was discharged on this from [place] and this was continued at [place] , discussed with daughter -Continue prednisone 15 mg daily and slowly taper over the next 1 to 2 weeks (ordered)  AKI -resolved -Continue to monitor   DVT prophylaxis: Lovenox SQ Code Status:Full code   Consultants:   PCCM  Procedures:   n/a  Antimicrobials:  Completed 7 day course of IV cefazoline    Subjective: Non-verbal but nods her heads and uses her hands for communication. Denies SOB, fevers or chills. Denies n/v/d/c.  Objective: Vitals:   10/09/19 1146 10/09/19 1200 10/09/19 1300  10/09/19 1400  BP: (!) 167/95 (!) 148/80 (!) 147/85 (!) 150/89  Pulse: 71 66 72 70  Resp: '17 15 15 20  '$ Temp:      TempSrc:      SpO2: 100% 100% 100% 100%  Weight:      Height:        Intake/Output Summary (Last 24 hours) at 10/09/2019 1422 Last data filed at 10/09/2019 1300 Gross per 24 hour  Intake 1320 ml  Output 800 ml  Net 520 ml   Filed Weights   10/07/19 0500 10/08/19 0500 10/09/19 0500  Weight: 59.8 kg 60.1 kg 58.8 kg    Examination:  General exam: Appears calm and comfortable.  Chronically ill and cachectic appearing Respiratory system: Coarse breath sounds throughout. Respiratory effort normal.  Status post tracheostomy on ventilator Cardiovascular system: S1 & S2 heard, RRR. No JVD, murmurs, rubs, gallops or clicks. No pedal edema. Gastrointestinal system: Abdomen is nondistended, soft and nontender. No organomegaly or masses felt. Normal bowel sounds heard.  PEG tube in place with dressing clean dry and intact Central nervous system: Alert and oriented.  Nonverbal but able to follow commands Extremities: Evidence of muscle wasting diffusely Skin: No rashes, lesions or ulcers    Scheduled Meds: . busPIRone  5 mg Per Tube BID  . chlorhexidine gluconate (MEDLINE KIT)  15 mL Mouth Rinse BID  . Chlorhexidine Gluconate Cloth  6 each Topical Daily  . clonazePAM  1 mg Per Tube BID  . clonazePAM  2 mg Per Tube QHS  . collagenase   Topical Daily  . enoxaparin (LOVENOX) injection  40 mg Subcutaneous Daily  . famotidine  20 mg Oral BID  .  feeding supplement (PRO-STAT SUGAR FREE 64)  30 mL Per Tube BID  . influenza vaccine adjuvanted  0.5 mL Intramuscular Tomorrow-1000  . insulin aspart  0-9 Units Subcutaneous Q4H  . mouth rinse  15 mL Mouth Rinse 10 times per day  . multivitamin with minerals  1 tablet Per Tube Daily  . predniSONE  15 mg Per Tube Q breakfast   Followed by  . [START ON 10/13/2019] predniSONE  10 mg Per Tube Q breakfast   Followed by  . [START ON  10/20/2019] predniSONE  5 mg Per Tube Q breakfast  . riluzole  50 mg Per Tube Q12H  . sertraline  150 mg Per Tube Daily   Continuous Infusions: . feeding supplement (JEVITY 1.2 CAL) 50 mL/hr at 10/09/19 1400     LOS: 7 days    Time spent: 25 mins  Charolotte Capuchin, MD [facility] Pager [phone]  If 7PM-7AM, please contact night-coverage [email or web address] Password [email or web address] 10/09/2019, 2:22 PM

## 2019-10-10 ENCOUNTER — Inpatient Hospital Stay (HOSPITAL_COMMUNITY): Payer: Medicare Other

## 2019-10-10 DIAGNOSIS — R0902 Hypoxemia: Secondary | ICD-10-CM

## 2019-10-10 DIAGNOSIS — J9611 Chronic respiratory failure with hypoxia: Secondary | ICD-10-CM

## 2019-10-10 DIAGNOSIS — Z930 Tracheostomy status: Secondary | ICD-10-CM

## 2019-10-10 LAB — GLUCOSE, CAPILLARY
Glucose-Capillary: 100 mg/dL — ABNORMAL HIGH (ref 70–99)
Glucose-Capillary: 102 mg/dL — ABNORMAL HIGH (ref 70–99)
Glucose-Capillary: 107 mg/dL — ABNORMAL HIGH (ref 70–99)
Glucose-Capillary: 123 mg/dL — ABNORMAL HIGH (ref 70–99)
Glucose-Capillary: 135 mg/dL — ABNORMAL HIGH (ref 70–99)
Glucose-Capillary: 225 mg/dL — ABNORMAL HIGH (ref 70–99)

## 2019-10-10 MED ORDER — ACETYLCYSTEINE 20 % IN SOLN
4.0000 mL | RESPIRATORY_TRACT | Status: DC
  Administered 2019-10-10 – 2019-10-12 (×12): 4 mL via RESPIRATORY_TRACT
  Filled 2019-10-10 (×13): qty 4

## 2019-10-10 MED ORDER — JUVEN PO PACK
1.0000 | PACK | Freq: Two times a day (BID) | ORAL | Status: DC
  Administered 2019-10-10 – 2019-10-12 (×5): 1
  Filled 2019-10-10 (×6): qty 1

## 2019-10-10 MED ORDER — ACETYLCYSTEINE 20 % IN SOLN
4.0000 mL | RESPIRATORY_TRACT | Status: DC
  Filled 2019-10-10 (×2): qty 4

## 2019-10-10 NOTE — Progress Notes (Signed)
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 She is chronically ventilator dependent. She has completed a course of IV cefazolin for MSSA in her sputum. Now pending placement at [facility].   Assessment & Plan:  Bilateral LL pneumonia; resolved Shock resolved. Staph aureus pneumonia and E. coli, both susceptible to Ancef. 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    -Continue vent support -PT and OT consulted; recommend LTACH -Need to determine if her ALS medications are available for [facility]-her daughter has been working on this -Palliative care following along for goals of care discussions  Protein calorie malnutrition, mild  -Continue tube feeds  Chronic glucocorticoid use-on IV hydrocortisone for unclear reason, apparently she was discharged on this from [place] and this was continued at [place] , discussed with daughter -Continue prednisone daily and slowly taper over the next 1 to 2 weeks (ordered)  AKI -resolved -Continue to monitor   DVT prophylaxis: Lovenox SQ Code Status:Full code   Consultants:   PCCM  Procedures:   n/a  Antimicrobials:  Completed 7 day course of IV cefazoline    Subjective: Non-verbal but nods her heads and uses her hands for communication. Denies SOB, fevers or chills. Denies n/v/d/c.  Objective: Vitals:   10/10/19 1126 10/10/19 1200 10/10/19  1300 10/10/19 1400  BP:  119/73 103/80 132/67  Pulse:  82 80 72  Resp:  '16 15 17  '$ Temp: 97.6 F (36.4 C)     TempSrc: Oral     SpO2:  100% 100% 100%  Weight:      Height:        Intake/Output Summary (Last 24 hours) at 10/10/2019 1508 Last data filed at 10/10/2019 1100 Gross per 24 hour  Intake 900 ml  Output 925 ml  Net -25 ml   Filed Weights   10/08/19 0500 10/09/19 0500 10/10/19 0500  Weight: 60.1 kg 58.8 kg 56 kg    Examination:  General exam: Appears calm and comfortable.  Chronically ill and cachectic appearing Respiratory system: Coarse breath sounds throughout. Respiratory effort normal.  Status post tracheostomy on ventilator Cardiovascular system: S1 & S2 heard, RRR. No JVD, murmurs, rubs, gallops or clicks. No pedal edema. Gastrointestinal system: Abdomen is nondistended, soft and nontender. No organomegaly or masses felt. Normal bowel sounds heard.  PEG tube in place with dressing clean dry and intact Central nervous system: Alert and oriented.  Nonverbal but able to follow commands Extremities: Evidence of muscle wasting diffusely Skin: No rashes, lesions or ulcers    Scheduled Meds: . acetylcysteine  4 mL Nebulization Q4H  . busPIRone  5 mg Per Tube BID  . chlorhexidine gluconate (MEDLINE KIT)  15 mL Mouth Rinse BID  . Chlorhexidine Gluconate Cloth  6 each Topical Daily  . clonazePAM  1 mg Per Tube BID  . clonazePAM  2 mg Per Tube QHS  . collagenase   Topical Daily  . enoxaparin (LOVENOX) injection  40 mg Subcutaneous Daily  .  famotidine  20 mg Oral BID  . feeding supplement (PRO-STAT SUGAR FREE 64)  30 mL Per Tube BID  . influenza vaccine adjuvanted  0.5 mL Intramuscular Tomorrow-1000  . insulin aspart  0-9 Units Subcutaneous Q4H  . mouth rinse  15 mL Mouth Rinse 10 times per day  . multivitamin with minerals  1 tablet Per Tube Daily  . nutrition supplement (JUVEN)  1 packet Per Tube BID BM  . predniSONE  15 mg Per Tube Q breakfast   Followed by  .  [START ON 10/13/2019] predniSONE  10 mg Per Tube Q breakfast   Followed by  . [START ON 10/20/2019] predniSONE  5 mg Per Tube Q breakfast  . riluzole  50 mg Per Tube Q12H  . sertraline  150 mg Per Tube Daily   Continuous Infusions: . feeding supplement (JEVITY 1.2 CAL) 1,000 mL (10/10/19 1026)     LOS: 8 days    Time spent: 25 mins  Charolotte Capuchin, MD [facility] Pager [phone]  If 7PM-7AM, please contact night-coverage [email or web address] Password [email or web address] 10/10/2019, 3:08 PM

## 2019-10-10 NOTE — Progress Notes (Signed)
CSW attempted to reach Cathlean Marseilles at [facility] without success, no voicemail option available. CSW sent text message to Santiago Glad requesting she provide a final decision to CSW ASAP.  Madilyn Fireman, MSW, LCSW-A Transitions of Care  Clinical Social Worker  [facility] Emergency Departments  Medical ICU [phone]

## 2019-10-10 NOTE — Progress Notes (Addendum)
NAME:  Emily Moody, MRN:  [id number], DOB:  04-04-1951, LOS: 8 ADMISSION DATE:  10/01/2019, CONSULTATION DATE:  10/28 REFERRING MD:  Johnney Killian, CHIEF COMPLAINT:  dyspnea  Brief History   68 year old woman with ALS, HTN, GERD, depression, here with worsening dyspnea, hypotension, LLL pna.    History of present illness   Admitted from [place] for increased SOB, increasing pulmonary infiltrate on CXR.  Was requiring higher than usual fio2 (60%) and higher peak pressures.   Increased pressures, and increased FIO2 needs. Hypotensive to 60s on arrival   Recent note from Pulmonology NP 09/22/19 at [facility]: on full vent support, FIO2 40% Rate 18, TV 400 PEEP 5.  They had not been successful with weaning. Had been treated for aspiration PNA, ARDS, improved.  Was discharged to [facility].   Chart review says hydrocortisone 156m Daily, but not on daughter's med list.    [facility]    Past Medical History  ALS HTN GERD Depression Decubitus ulcer  Significant Hospital Events   10[facility]27-[facility] admission to [facility]  Consults:  Wound care  Procedures:    Significant Diagnostic Tests:  CXR 11/03 >> Tracheostomy tube terminates above carina, persistent right lower lobe atelectasis/collapse.    Micro Data:  COVID 12/27 >> neg Urine culture >> neg Sputum cultures 10/28 >> staph aureus,   BAL 10/29 >> Staph aureus resistant to erythromycin and Ecoli pansensitive  Blood cultures 10/27 >> neg  Antimicrobials:  Cefazolin >> 10/30-11/2 (last day 11/2) Cefepime >> 10/28-10/30 Vanc >> 10/27-10/29 Flagyl >> 10/27-10/29  Interim history/subjective:  RN reports 2 episodes of desaturation overnight with required increased on FiO2. She is seen lying in bed with complaints of buttocks pain at pressure site/  Objective   Blood pressure (!) 141/78, pulse 73, temperature 98.4 F (36.9 C), temperature source Oral, resp. rate 15, height _0  (1.651 m), weight 56  kg, SpO2 100 %.    Vent Mode: PRVC FiO2 (%):  [50 %-80 %] 80 % Set Rate:  [12 bmp] 12 bmp Vt Set:  [500 mL] 500 mL PEEP:  [8 cmH20] 8 cmH20 Plateau Pressure:  [22 cmH20-31 cmH20] 22 cmH20   Intake/Output Summary (Last 24 hours) at 10/10/2019 04098Last data filed at 10/10/2019 0600 Gross per 24 hour  Intake 950 ml  Output 925 ml  Net 25 ml   Filed Weights   10/08/19 0500 10/09/19 0500 10/10/19 0500  Weight: 60.1 kg 58.8 kg 56 kg    Examination: General: Chronically ill appearing thin  Elderly female on mechanical ventilation, in NAD HEENT: ETT, MM pink/moist, PERRL,  Neuro: Alert and oriented with ability to nod to simple questions  CV: s1s2 regular rate and rhythm, no murmur, rubs, or gallops,  PULM:  Diffuse scattered rhonchi in all lung fields, very weak ineffective cough on vent   GI: soft, bowel sounds active in all 4 quadrants, non-tender, non-distended, tolerating TF through PEG  Extremities: warm/dry, no edema  Skin: no rashes or lesions, Stage 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 -s/p tracheostomy, now chronically ventilated 24/7 P: Continue vent support  PT/OT consults Need to determine if ALS meds available at SNF   Chronic glucocorticoid use -At admission on IV hydrocortisone for unclear reason, apparently she was  discharged on this from [place] and this was continued at [place] , discussed with daughter P: Continue prednisone slow taper over next 1-2 weeks    Rest per primary team   Best practice:  Diet: npo, tube feeds Pain/Anxiety/Delirium protocol (if indicated): fent prn VAP protocol (if indicated): In place  DVT prophylaxis: lovenox daily GI prophylaxis: Famotidine Glucose control: SSI Mobility: bedrest Code Status: Full Family Communication: Per primary team Disposition: ICU  Signature:   Johnsie Cancel, NP-C [facility] After hours pager: [phone]. 10/10/2019, 8:56 AM  Attending Note:  68 year old female with chronic respiratory failure presenting to PCCM for vent and trach management for ALS.  No events overnight, no new complaints.  On exam, coarse BS diffusely.  I reviewed CXR myself, trach is in a good position and infiltrate noted.  Discussed with PCCM-NP.   Chronic respiratory failure:  - Hold weaning  Hypoxemia:  - Titrate O2 for sat of 88-92%  Trach status:  - Maintain current trach type and size  - Trach management per protocol  PCCM will continue to follow  Patient seen and examined, agree with above note.  I dictated the care and orders written for this patient under my direction.  Rush Farmer, M.D. [facility] Pulmonary/Critical Care Medicine

## 2019-10-10 NOTE — Progress Notes (Addendum)
Nutrition Follow-up RD working remotely.  DOCUMENTATION CODES:   Not applicable  INTERVENTION:   Continue TF via PEG:   Jevity 1.2 at 50 ml/h (1200 ml per day)   Pro-stat 30 ml BID   Provides 1640 kcal, 97 gm protein, 972 ml free water daily   MVI with minerals daily   Add Juven BID via tube, each packet provides 80 calories, 8 grams of carbohydrate, 2.5 grams of protein (collagen), 7 grams of L-arginine and 7 grams of L-glutamine; supplement contains CaHMB, Vitamins C, E, B12 and Zinc to promote wound healing  NUTRITION DIAGNOSIS:   Inadequate oral intake related to inability to eat as evidenced by NPO status.  GOAL:   Patient will meet greater than or equal to 90% of their needs  MONITOR:   TF tolerance, Skin, I & O's, Labs  ASSESSMENT:   68 yo female admitted from [place] with LLL PNA. PMH includes ALS, chronic trach & vent, HTN, GERD, depression.   Plans for d/c to [facility] facility soon.  Palliative Care team following.  Patient remains on chronic ventilator support via trach. MV: 6 L/min Temp (24hrs), Avg:98.1 F (36.7 C), Min:97.5 F (36.4 C), Max:98.6 F (37 C)    Labs reviewed.  CBG's: 107-100  Medications reviewed and include novolog, prednisone, MVI.  Weight is down by 4.8 kg since admission. I/O + 3.8 L since admission  NUTRITION - FOCUSED PHYSICAL EXAM:  unable to complete  Diet Order:   Diet Order            Diet NPO time specified  Diet effective now              EDUCATION NEEDS:   No education needs have been identified at this time  Skin:  Skin Assessment: Skin Integrity Issues: Skin Integrity Issues:: Stage III Stage III: sacrum  Last BM:  11/4 type 5  Height:   Ht Readings from Last 1 Encounters:  10/02/19 5\' 5"  (1.651 m)    Weight:   Wt Readings from Last 1 Encounters:  10/10/19 56 kg    Ideal Body Weight:  56.8 kg  BMI:  Body mass index is 20.54 kg/m.  Estimated Nutritional Needs:   Kcal:   1600-1800  Protein:  90-100 gm  Fluid:  >/= 1.5 L    Molli Barrows, RD, LDN, [place] Pager [phone] After Hours Pager [phone]

## 2019-10-11 LAB — GLUCOSE, CAPILLARY
Glucose-Capillary: 112 mg/dL — ABNORMAL HIGH (ref 70–99)
Glucose-Capillary: 121 mg/dL — ABNORMAL HIGH (ref 70–99)
Glucose-Capillary: 193 mg/dL — ABNORMAL HIGH (ref 70–99)
Glucose-Capillary: 122 mg/dL — ABNORMAL HIGH (ref 70–99)
Glucose-Capillary: 104 mg/dL — ABNORMAL HIGH (ref 70–99)

## 2019-10-11 LAB — SARS CORONAVIRUS 2 (TAT 6-24 HRS): SARS Coronavirus 2: NEGATIVE

## 2019-10-11 MED ORDER — FAMOTIDINE 40 MG/5ML PO SUSR: 20.0000 mg | Freq: Two times a day (BID) | ORAL | 0 refills | Status: DC

## 2019-10-11 MED ORDER — PREDNISONE 5 MG/5ML PO SOLN: 15.0000 mg | Freq: Every day | ORAL | 0 refills | Status: AC

## 2019-10-11 MED ORDER — PREDNISONE 5 MG/5ML PO SOLN: 5.0000 mg | Freq: Every day | ORAL | 0 refills | Status: AC

## 2019-10-11 MED ORDER — POLYETHYLENE GLYCOL 3350 17 G PO PACK
17.0000 g | PACK | Freq: Every day | ORAL | Status: DC
  Administered 2019-10-11: 17 g
  Filled 2019-10-11: qty 1

## 2019-10-11 MED ORDER — COLLAGENASE 250 UNIT/GM EX OINT: TOPICAL_OINTMENT | Freq: Every day | CUTANEOUS | 0 refills | Status: AC

## 2019-10-11 MED ORDER — PREDNISONE 5 MG/5ML PO SOLN: 10.0000 mg | Freq: Every day | ORAL | 0 refills | Status: AC

## 2019-10-11 MED ORDER — CLONAZEPAM 2 MG PO TABS: 2.0000 mg | ORAL_TABLET | Freq: Every day | ORAL | 0 refills | Status: AC

## 2019-10-11 MED ORDER — DOCUSATE SODIUM 50 MG/5ML PO LIQD
100.0000 mg | Freq: Two times a day (BID) | ORAL | Status: DC | PRN
  Administered 2019-10-11: 100 mg

## 2019-10-11 MED ORDER — SODIUM CHLORIDE 3 % IN NEBU: 4.0000 mL | INHALATION_SOLUTION | RESPIRATORY_TRACT | 12 refills | Status: DC | PRN

## 2019-10-11 MED ORDER — CLONAZEPAM 1 MG PO TABS: 1.0000 mg | ORAL_TABLET | Freq: Two times a day (BID) | ORAL | 0 refills | Status: AC

## 2019-10-11 MED ORDER — OXYCODONE HCL 5 MG/5ML PO SOLN: 5.0000 mg | Freq: Four times a day (QID) | ORAL | 0 refills | Status: AC | PRN

## 2019-10-11 MED ORDER — TRAMADOL HCL 50 MG PO TABS: 25.0000 mg | ORAL_TABLET | Freq: Four times a day (QID) | ORAL | 3 refills | Status: AC | PRN

## 2019-10-11 MED ORDER — INFLUENZA VAC A&B SA ADJ QUAD 0.5 ML IM PRSY: 0.5000 mL | PREFILLED_SYRINGE | INTRAMUSCULAR | 0 refills | Status: AC

## 2019-10-11 NOTE — TOC Transition Note (Signed)
Transition of Care [facility]) - CM/SW Discharge Note   Patient Details  Name: TIAIRA ARAMBULA MRN: [id number] Date of Birth: 1951-01-09  [facility] of [facility] CM/SW Contact:  Archie Endo, LCSW Phone Number: 10/11/2019, 10:41 AM   Clinical Narrative:     The patient will go to [facility] transport but was told by [facility] that there are several others on the waiting list waiting for transport. [facility] stated if the patient is not retrieved tonight, that it will be first thing Saturday morning. The room number is C303. The accepting provider is Dr. Inda Coke. The number to call for report is [phone].   The facility is requesting a new COVID prior to transfer, RN was notified.  Final next level of care: Skilled Nursing Facility Barriers to Discharge: No Barriers Identified   Patient Goals and CMS Choice        Discharge Placement              Patient chooses bed at: [facility] Patient to be transferred to facility by: [place] Name of family member notified: Leafy Ro, [phone] Patient and family notified of of transfer: 10/11/19  Discharge Plan and Services                                     Social Determinants of Health (SDOH) Interventions     Readmission Risk Interventions No flowsheet data found.

## 2019-10-11 NOTE — Progress Notes (Addendum)
RT note- Attempt to wean Fio2 and PEEP before patient transfers to SNF. Has tolerated changes well. Continue to monitor.

## 2019-10-11 NOTE — Progress Notes (Signed)
NAME:  Emily Moody, MRN:  [id number], DOB:  08/15/1951, LOS: 9 ADMISSION DATE:  10/01/2019, CONSULTATION DATE:  10/28 REFERRING MD:  Johnney Killian, CHIEF COMPLAINT:  dyspnea  Brief History   68 year old woman with ALS, HTN, GERD, depression, here with worsening dyspnea, hypotension, LLL pna.    History of present illness   Admitted from [place] for increased SOB, increasing pulmonary infiltrate on CXR.  Was requiring higher than usual fio2 (60%) and higher peak pressures.   Increased pressures, and increased FIO2 needs. Hypotensive to 60s on arrival   Recent note from Pulmonology NP 09/22/19 at [facility]: on full vent support, FIO2 40% Rate 18, TV 400 PEEP 5.  They had not been successful with weaning. Had been treated for aspiration PNA, ARDS, improved.  Was discharged to [facility].   Chart review says hydrocortisone 180m Daily, but not on daughter's med list.    [facility]    Past Medical History  ALS HTN GERD Depression Decubitus ulcer  Significant Hospital Events   [place] [facility]  Consults:  Wound care  Procedures:    Significant Diagnostic Tests:  CXR 11/03 >> Tracheostomy tube terminates above carina, persistent right lower lobe atelectasis/collapse.    Micro Data:  COVID 12/27 >> neg Urine culture >> neg Sputum cultures 10/28 >> staph aureus,   BAL 10/29 >> Staph aureus resistant to erythromycin and Ecoli pansensitive  Blood cultures 10/27 >> neg  Antimicrobials:  Cefazolin >> 10/30-11/2 (last day 11/2) Cefepime >> 10/28-10/30 Vanc >> 10/27-10/29 Flagyl >> 10/27-10/29  Interim history/subjective:  Remains on high FiO2 and PEEP No events overnight Chronic vent  Objective   Blood pressure (!) 118/99, pulse 69, temperature 98.2 F (36.8 C), temperature source Oral, resp. rate 16, height _0  (1.651 m), weight 56 kg, SpO2 100 %.    Vent Mode: PRVC FiO2 (%):  [60 %-75 %] 60 % Set Rate:  [12 bmp] 12  bmp Vt Set:  [500 mL] 500 mL PEEP:  [8 cmH20] 8 cmH20 Plateau Pressure:  [19 cmH20-24 cmH20] 19 cmH20   Intake/Output Summary (Last 24 hours) at 10/11/2019 1003 Last data filed at 10/11/2019 0800 Gross per 24 hour  Intake 1180 ml  Output 675 ml  Net 505 ml   Filed Weights   10/08/19 0500 10/09/19 0500 10/10/19 0500  Weight: 60.1 kg 58.8 kg 56 kg    Examination: General: Chronically ill appearing female, NAD HEENT: [place]/AT, PERRL, EOM-I and MMM, trach in place Neuro: Awake and interactive, nods to questions CV: RRR, Nl S1/S2 and -M/R/G PULM:  Coarse BS diffusely GI: Soft, NT, ND and +BS, PEG in place Extremities: warm/dry, no edema  Skin: no rashes or lesions, 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 -s/p tracheostomy, now chronically ventilated 24/7 P: Continue vent support  PT/OT consults Need to determine if ALS meds available at SNF   Chronic glucocorticoid use -At admission on IV hydrocortisone for unclear reason, apparently she was discharged on this from [place] and this was continued at [place] , discussed with daughter P: Continue prednisone slow taper over next 1-2 weeks but do not D/C   Trach status: Continue current trach type and size No downsize or decannulation consideration Trach care per protocol  PCCM will continue to follow for vent/trach management  Rush Farmer, M.D. [facility].

## 2019-10-11 NOTE — Progress Notes (Signed)
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 Admitted from [place] for increased SOB, increasing pulmonary infiltrate on CXR. Per daughter (an RT at [facility]), she had her trach in late Sept at [place]. Prior to admission in September she could still walk with a walker (had just started needing in July 2020), needing her vent only at night, hasn't been able to talk in about 1 year.     PT Comments    Pt asleep on entry, but wakes easily. Pt agreeable to sit EoB with therapy, however requests to use bed pan prior to mobility. Pt requires minA for rolling and bed pan placement. Pt is mod Ax2 for coming to EoB. Pt with increased work of breathing in upright but becomes more comfortable with time. Attempt seated LE exercise however pt is too weak and requires assist for steadying in seated. After about 5 minutes pt reports feeling nauseous and request to return to bed. Pt required maxAx2 for return to bed. Pt bed placed in chair position for continued upright posture. D/c plans remain appropriate.     Follow Up Recommendations  LTACH;SNF;Supervision/Assistance - 24 hour     Equipment Recommendations  None recommended by PT    Recommendations for Other Services       Precautions / Restrictions Precautions Precautions: Fall;Other (comment) Precaution Comments: multiple lines, tubes Restrictions Weight Bearing Restrictions: No    Mobility  Bed Mobility Overal bed mobility: Needs Assistance Bed Mobility: Rolling;Sidelying to Sit;Sit to Sidelying Rolling: Min assist Sidelying to sit: Mod assist;+2 for physical assistance     Sit to sidelying: Max assist;+2 for physical assistance General bed mobility comments: min A for rolling for bed pan placement, modAx2 for LE off bed,  trunk to upright and pad scoot of hips to EoB, pt able to sit for 5 minutes before requiring maxAx2 for back to bed      Balance Overall balance assessment: Needs assistance Sitting-balance support: Bilateral upper extremity supported;Feet unsupported Sitting balance-Leahy Scale: Poor Sitting balance - Comments: Min A for right lateral lean Postural control: Other (comment)(anterior lean)                                  Cognition Arousal/Alertness: Lethargic Behavior During Therapy: Flat affect Overall Cognitive Status: Difficult to assess                                 General Comments: following 1 step commands with some delay      Exercises      General Comments General comments (skin integrity, edema, etc.): on vent: PRVC FiO2 50% PeEP 8, SaO2 94-100%,       Pertinent Vitals/Pain Pain Assessment: Faces Faces Pain Scale: Hurts even more Pain Location: points to her chest, and peg tube site Pain Descriptors / Indicators: Grimacing;Guarding           PT Goals (current goals can now be found in the care plan section) Acute Rehab PT Goals Patient Stated Goal: pt unable to state; gave thumbs up to getting stronger PT Goal Formulation: Patient unable to participate in goal setting Time For Goal Achievement: 10/19/19 Potential to Achieve Goals: Fair Progress towards PT goals:  Not progressing toward goals - comment(limited by pain )    Frequency    Min 2X/week      PT Plan Current plan remains appropriate       AM-PAC PT "6 Clicks" Mobility   Outcome Measure  Help needed turning from your back to your side while in a flat bed without using bedrails?: A Little Help needed moving from lying on your back to sitting on the side of a flat bed without using bedrails?: A Lot Help needed moving to and from a bed to a chair (including a wheelchair)?: Total Help needed standing up from a chair using your arms (e.g., wheelchair or bedside  chair)?: Total Help needed to walk in hospital room?: Total Help needed climbing 3-5 steps with a railing? : Total 6 Click Score: 9    End of Session Equipment Utilized During Treatment: Oxygen Activity Tolerance: Patient limited by fatigue;Patient limited by pain Patient left: in bed;with call bell/phone within reach Nurse Communication: Mobility status;Other (comment)(dangle EOB) PT Visit Diagnosis: Muscle weakness (generalized) (M62.81);Difficulty in walking, not elsewhere classified (R26.2);Other symptoms and signs involving the nervous system (U20.254)     Time: 2706-2376 PT Time Calculation (min) (ACUTE ONLY): 26 min  Charges:  $Therapeutic Activity: 23-37 mins                     Tarahji Ramthun B. Beverely Risen PT, DPT Acute Rehabilitation Services Pager [phone] Office [phone]    Elon Alas Fleet 10/11/2019, 2:21 PM

## 2019-10-11 NOTE — Discharge Summary (Addendum)
Physician Discharge Summary  Emily Moody LFY:[id number] DOB: 1950-12-28 DOA: 10/01/2019  PCP: [name], MD  Admit date: 10/01/2019 Discharge date: 10/12/2019  Admitted From: [facility] Disposition:  LTACH   Recommendations for Outpatient Follow-up:  1. Follow up with PCP in 1-2 weeks 2. 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 She is chronically ventilator dependent. She has completed a course of IV cefazolin for MSSA in her sputum and was pending LTACH placement for the last 3 days. She is back to her baseline vent settings. Unclear why patient was still on IV hydrocortisone upon arrival however that has since been switched over to a prednisone taper. She will be discharged on prednisone 76m/daily and then tapered to 130mdaily before finally finishing off with 39m48maily. Palliative care initiated a discussion regarding goals of care but it is highly recommended that this discussion be continued as an outpatient with the patient's HC-POA given this patient's guarded clinical status and overall prognosis.  Discharge Diagnoses:   Bilateral LL pneumonia; resolved Shock resolved. Staph aureus pneumoniaand E. coli, both susceptible to Ancef. -completedcefazolin 7-day course of antibiotics; last dose was 11/2  Acute on chronic hypoxemic vent-dependent respiratory failure- improving Trached late Sept at [facility].  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  -Continue vent support -PT and OT consulted; recommend LTACH -Need to determine if her ALS medications are available for [facility]-her daughter has been working on this -Palliative care following along for goals of care discussions  Protein calorie malnutrition, mild  -Continue tube feeds  Chronic glucocorticoid use-on IV hydrocortisone for unclear reason, [facility] -Continue prednisone daily and slowly taper over the next 1 to 2 weeks (ordered)  AKI -resolved -Continue to monitor  Discharge Instructions: Highly recommend Palliative care involvement for goals of care discussion in this chronically ill woman who is still Full Code but is trach and PEG dependent.  Discharge Instructions    Diet - low sodium heart healthy   Complete by: As directed    Increase activity slowly   Complete by: As directed      Allergies as of 10/11/2019      Reactions   Morphine    Other reaction(s): Delusions (intolerance), Hallucinations, Mental Status Changes (intolerance)      Medication List    STOP taking these medications   amLODipine 10 MG tablet Commonly known as: NORVASC   Flomax 0.4 MG Caps capsule Generic drug: tamsulosin   furosemide 10 MG/ML injection Commonly known as: LASIX   metoprolol tartrate 25 MG tablet Commonly known as: LOPRESSOR   Solu-CORTEF 100 MG Solr injection Generic drug: hydrocortisone sodium succinate     TAKE these medications   busPIRone 5 MG tablet Commonly known as: BUSPAR Give 5 mg by tube 2 (two) times daily.   chlorhexidine 0.12 % solution Commonly known as: PERIDEX Use as directed 5 mLs in the mouth or throat 2 (two) times daily.  Start of shift   clonazePAM 1 MG tablet Commonly known as: KLONOPIN Place 1 tablet (1 mg total) into feeding tube 2 (two) times daily. What changed: when to take this   clonazePAM 2 MG  tablet Commonly known as: KLONOPIN Place 1 tablet (2 mg total) into feeding tube at bedtime. What changed: You were already taking a medication with the same name, and this prescription was added. Make sure you understand how and when to take each.   collagenase ointment Commonly known as: SANTYL Apply topically daily. Start taking on: October 12, 2019   enoxaparin 40 MG/0.4ML injection Commonly known as: LOVENOX Inject 40 mg into the skin daily.   influenza vaccine adjuvanted 0.5 ML injection Commonly known as: FLUAD Inject 0.5 mLs into the muscle tomorrow at 10 am for 1 dose.   Multivitamin Adult Tabs Place 1 tablet into feeding tube daily.   NovoLOG FlexPen 100 UNIT/ML FlexPen Generic drug: insulin aspart Inject 0-10 Units into the skin every 6 (six) hours. 0-150 = 0 units 151-200 = 2 units 201-250 = 4 units 251-300 = 6 units 301-350 = 8 units 351-400 = 10 units >400 notify MD   oxyCODONE 5 MG/5ML solution Commonly known as: ROXICODONE Place 5 mLs (5 mg total) into feeding tube every 6 (six) hours as needed for breakthrough pain.   predniSONE 5 MG/5ML solution Place 15 mLs (15 mg total) into feeding tube daily with breakfast. Start taking on: October 12, 2019   predniSONE 5 MG/5ML solution Place 10 mLs (10 mg total) into feeding tube daily with breakfast. Start taking on: October 13, 2019   predniSONE 5 MG/5ML solution Place 5 mLs (5 mg total) into feeding tube daily with breakfast. Start taking on: October 20, 2019   Pro-Stat [facility] Liqd Place 30 mLs into feeding tube 3 (three) times daily.   riluzole 50 MG tablet Commonly known as: RILUTEK Place 50 mg into feeding tube every 12 (twelve) hours.   sertraline 50 MG tablet Commonly known as: ZOLOFT Give 150 mg by tube daily.   traMADol 50 MG tablet Commonly known as: ULTRAM Place 0.5 tablets (25 mg total) into feeding tube every 6 (six) hours as needed for moderate pain.            Durable Medical  Equipment  (From admission, onward)         Start     Ordered   10/05/19 0953  For home use only DME Cough assist device  Once     10/05/19 0952          Allergies  Allergen Reactions  . Morphine     Other reaction(s): Delusions (intolerance), Hallucinations, Mental Status Changes (intolerance)    Consultations:  PCCM and Palliative care   Procedures/Studies: Dg Chest Port 1 View  Result Date: 10/10/2019 CLINICAL DATA:  68 year old female with a history of hypoxia EXAM: PORTABLE CHEST 1 VIEW COMPARISON:  11/04/2019 FINDINGS: Cardiomediastinal silhouette unchanged in size and contour. Unchanged tracheostomy. Improved aeration of the lungs with resolution of right basilar opacity and improved visualization of the right heart border and the right hemidiaphragm. No pneumothorax. No large pleural effusion. Coarsened interstitial markings throughout. IMPRESSION: Improved aeration at the right lung base with chronic lung changes present. Unchanged tracheostomy Electronically Signed   By: Corrie Mckusick D.O.   On: 10/10/2019 10:09   Dg Chest Port 1 View  Result Date: 10/08/2019 CLINICAL DATA:  Shortness of breath EXAM: PORTABLE CHEST 1 VIEW COMPARISON:  10/03/2019 FINDINGS: The  heart size is stable. The tracheostomy tube terminates above the carina. There is a partially visualized right-sided PICC line terminating in the right axillary region. There is persistent right lower lobe atelectasis/collapse. There are areas of atelectasis in the left lower lobe. There is no pneumothorax. Small bilateral pleural effusions are not excluded. IMPRESSION: No significant interval change. Electronically Signed   By: Constance Holster M.D.   On: 10/08/2019 05:24   Dg Chest Port 1 View  Result Date: 10/03/2019 CLINICAL DATA:  Pneumonia EXAM: PORTABLE CHEST 1 VIEW COMPARISON:  Two days ago FINDINGS: Interval collapse of the right lower lobe with elevated diaphragm. Airspace disease on the left with mild  improvement in aeration. No visible effusion or pneumothorax. Borderline heart size. Prominent mitral annular calcification. Midline catheter on the right with tip at the axilla. Tracheostomy tube in place IMPRESSION: 1. Interval right lower lobe collapse. 2. Left-sided airspace disease with improved aeration. Electronically Signed   By: Monte Fantasia M.D.   On: 10/03/2019 05:59   Dg Chest Port 1 View  Result Date: 10/01/2019 CLINICAL DATA:  Hypoxia EXAM: PORTABLE CHEST 1 VIEW COMPARISON:  September 20, 2019. FINDINGS: Tracheostomy catheter tip is 4.5 cm above the carina. No pneumothorax. There is airspace consolidation in the left lower lobe with small left pleural effusion. The lungs elsewhere are clear. Heart is mildly enlarged with pulmonary vascularity normal. No adenopathy. There is aortic atherosclerosis. Bones are osteoporotic. IMPRESSION: Tracheostomy as described without pneumothorax. Airspace consolidation consistent with pneumonia left lower lobe with small left pleural effusion. Lungs elsewhere clear. Stable cardiac prominence. No adenopathy. Aortic Atherosclerosis (ICD10-I70.0). Electronically Signed   By: Lowella Grip III M.D.   On: 10/01/2019 19:22   Dg Chest Port 1 View  Result Date: 09/20/2019 CLINICAL DATA:  Respiratory failure EXAM: PORTABLE CHEST 1 VIEW COMPARISON:  09/02/2019 FINDINGS: Tracheostomy is in stable position. Low lung volumes. Bibasilar atelectasis. Cardiomegaly. No overt edema or acute bony abnormality. IMPRESSION: Tracheostomy in stable position. Cardiomegaly. Low volumes, bibasilar atelectasis. Electronically Signed   By: Rolm Baptise M.D.   On: 09/20/2019 11:48   (Echo, Carotid, EGD, Colonoscopy, ERCP)    Subjective:   Discharge Exam: Vitals:   10/11/19 1515 10/11/19 1600  BP:  125/75  Pulse:  80  Resp:  17  Temp: 99.2 F (37.3 C)   SpO2: 100% 100%   Vitals:   10/11/19 1500 10/11/19 1506 10/11/19 1515 10/11/19 1600  BP: 139/65   125/75  Pulse:  88   80  Resp: 16   17  Temp:   99.2 F (37.3 C)   TempSrc:   Oral   SpO2: 100% 100% 100% 100%  Weight:      Height:       General exam: Appears calm and comfortable.  Chronically ill and cachectic appearing Respiratory system: Coarse breath sounds throughout. Respiratory effort normal.  Status post tracheostomy on ventilator Cardiovascular system: S1 & S2 heard, RRR. No JVD, murmurs, rubs, gallops or clicks. No pedal edema. Gastrointestinal system: Abdomen is nondistended, soft and nontender. No organomegaly or masses felt. Normal bowel sounds heard.  PEG tube in place with dressing clean dry and intact Central nervous system: Alert and oriented.  Nonverbal but able to follow commands Extremities: Evidence of muscle wasting diffusely Skin: No rashes, lesions or ulcers     The results of significant diagnostics from this hospitalization (including imaging, microbiology, ancillary and laboratory) are listed below for reference.     Microbiology: Recent Results (from the past  240 hour(s))  SARS Coronavirus 2 by RT PCR (hospital order, performed in [facility] hospital lab) Nasopharyngeal Nasopharyngeal Swab     Status: None   Collection Time: 10/01/19  6:50 PM   Specimen: Nasopharyngeal Swab  Result Value Ref Range Status   SARS Coronavirus 2 NEGATIVE NEGATIVE Final    Comment: (NOTE) If result is NEGATIVE SARS-CoV-2 target nucleic acids are NOT DETECTED. The SARS-CoV-2 RNA is generally detectable in upper and lower  respiratory specimens during the acute phase of infection. The lowest  concentration of SARS-CoV-2 viral copies this assay can detect is 250  copies / mL. A negative result does not preclude SARS-CoV-2 infection  and should not be used as the sole basis for treatment or other  patient management decisions.  A negative result may occur with  improper specimen collection / handling, submission of specimen other  than nasopharyngeal swab, presence of viral mutation(s)  within the  areas targeted by this assay, and inadequate number of viral copies  (<250 copies / mL). A negative result must be combined with clinical  observations, patient history, and epidemiological information. If result is POSITIVE SARS-CoV-2 target nucleic acids are DETECTED. The SARS-CoV-2 RNA is generally detectable in upper and lower  respiratory specimens dur ing the acute phase of infection.  Positive  results are indicative of active infection with SARS-CoV-2.  Clinical  correlation with patient history and other diagnostic information is  necessary to determine patient infection status.  Positive results do  not rule out bacterial infection or co-infection with other viruses. If result is PRESUMPTIVE POSTIVE SARS-CoV-2 nucleic acids MAY BE PRESENT.   A presumptive positive result was obtained on the submitted specimen  and confirmed on repeat testing.  While 2019 novel coronavirus  (SARS-CoV-2) nucleic acids may be present in the submitted sample  additional confirmatory testing may be necessary for epidemiological  and / or clinical management purposes  to differentiate between  SARS-CoV-2 and other Sarbecovirus currently known to infect humans.  If clinically indicated additional testing with an alternate test  methodology [phone]) is advised. The SARS-CoV-2 RNA is generally  detectable in upper and lower respiratory sp ecimens during the acute  phase of infection. The expected result is Negative. Fact Sheet for Patients:  StrictlyIdeas.no Fact Sheet for Healthcare Providers: BankingDealers.co.za This test is not yet approved or cleared by the [place] FDA and has been authorized for detection and/or diagnosis of SARS-CoV-2 by FDA under an Emergency Use Authorization (EUA).  This EUA will remain in effect (meaning this test can be used) for the duration of the COVID-19 declaration under Section 564(b)(1) of the Act,  21 U.S.C. section 360bbb-3(b)(1), unless the authorization is terminated or revoked sooner. Performed at [facility], [place] [address] [place], [place] [address]   Blood Culture (routine x 2)     Status: None   Collection Time: 10/01/19  6:52 PM   Specimen: BLOOD RIGHT ARM  Result Value Ref Range Status   Specimen Description BLOOD RIGHT ARM  Final   Special Requests   Final    BOTTLES DRAWN AEROBIC AND ANAEROBIC Blood Culture adequate volume   Culture   Final    NO GROWTH 5 DAYS Performed at [facility], [place] [address] [place],  [address]    Report Status 10/06/2019 FINAL  Final  Blood Culture (routine x 2)     Status: None   Collection Time: 10/01/19  7:30 PM   Specimen: BLOOD  Result  Value Ref Range Status   Specimen Description BLOOD LEFT ANTECUBITAL  Final   Special Requests   Final    BOTTLES DRAWN AEROBIC AND ANAEROBIC Blood Culture adequate volume   Culture   Final    NO GROWTH 5 DAYS Performed at [facility], [address], [place], [place] [address]    Report Status 10/06/2019 FINAL  Final  Urine culture     Status: None   Collection Time: 10/01/19 11:50 PM   Specimen: In/Out Cath Urine  Result Value Ref Range Status   Specimen Description IN/OUT CATH URINE  Final   Special Requests NONE  Final   Culture   Final    NO GROWTH Performed at [facility], [place] [address] [place], [place] [address]    Report Status 10/02/2019 FINAL  Final  Culture, respiratory (tracheal aspirate)     Status: None   Collection Time: 10/02/19  3:20 AM   Specimen: Tracheal Aspirate; Respiratory  Result Value Ref Range Status   Specimen Description TRACHEAL ASPIRATE  Final   Special Requests NONE  Final   Gram Stain   Final    RARE WBC PRESENT,BOTH PMN AND MONONUCLEAR RARE GRAM POSITIVE COCCI IN PAIRS Performed at [facility], [place] [address] [place], [place] [address]    Culture FEW STAPHYLOCOCCUS AUREUS  Final   Report Status 10/05/2019  FINAL  Final   Organism ID, Bacteria STAPHYLOCOCCUS AUREUS  Final      Susceptibility   Staphylococcus aureus - MIC*    CIPROFLOXACIN <=0.5 SENSITIVE Sensitive     ERYTHROMYCIN >=8 RESISTANT Resistant     GENTAMICIN <=0.5 SENSITIVE Sensitive     OXACILLIN <=0.25 SENSITIVE Sensitive     TETRACYCLINE <=1 SENSITIVE Sensitive     VANCOMYCIN 1 SENSITIVE Sensitive     TRIMETH/SULFA <=10 SENSITIVE Sensitive     CLINDAMYCIN <=0.25 SENSITIVE Sensitive     RIFAMPIN <=0.5 SENSITIVE Sensitive     Inducible Clindamycin NEGATIVE Sensitive     * FEW STAPHYLOCOCCUS AUREUS  MRSA PCR Screening     Status: None   Collection Time: 10/02/19  9:05 PM   Specimen: Nasal Mucosa; Nasopharyngeal  Result Value Ref Range Status   MRSA by PCR NEGATIVE NEGATIVE Final    Comment:        The GeneXpert MRSA Assay (FDA approved for NASAL specimens only), is one component of a comprehensive MRSA colonization surveillance program. It is not intended to diagnose MRSA infection nor to guide or monitor treatment for MRSA infections. Performed at [facility], [place] [address], [place], [place] [address]   Culture, bal-quantitative     Status: Abnormal   Collection Time: 10/03/19 10:48 AM   Specimen: Bronchoalveolar Lavage; Respiratory  Result Value Ref Range Status   Specimen Description BRONCHIAL ALVEOLAR LAVAGE  Final   Special Requests NONE  Final   Gram Stain   Final    FEW WBC PRESENT, PREDOMINANTLY PMN FEW YEAST FEW GRAM POSITIVE COCCI IN PAIRS IN CLUSTERS FEW GRAM POSITIVE RODS Performed at [facility], [place] [address], [place], [place] [address]    Culture (A)  Final    70,000 COLONIES/mL STAPHYLOCOCCUS AUREUS 80,000 COLONIES/mL ESCHERICHIA COLI    Report Status 10/05/2019 FINAL  Final   Organism ID, Bacteria STAPHYLOCOCCUS AUREUS (A)  Final   Organism ID, Bacteria ESCHERICHIA COLI (A)  Final      Susceptibility   Escherichia coli - MIC*    AMPICILLIN  8 SENSITIVE Sensitive      CEFAZOLIN <=4 SENSITIVE Sensitive     CEFEPIME <=1 SENSITIVE Sensitive     CEFTAZIDIME <=1 SENSITIVE Sensitive     CEFTRIAXONE <=1 SENSITIVE Sensitive     CIPROFLOXACIN <=0.25 SENSITIVE Sensitive     GENTAMICIN <=1 SENSITIVE Sensitive     IMIPENEM <=0.25 SENSITIVE Sensitive     TRIMETH/SULFA <=20 SENSITIVE Sensitive     AMPICILLIN/SULBACTAM 4 SENSITIVE Sensitive     PIP/TAZO <=4 SENSITIVE Sensitive     Extended ESBL NEGATIVE Sensitive     * 80,000 COLONIES/mL ESCHERICHIA COLI   Staphylococcus aureus - MIC*    CIPROFLOXACIN <=0.5 SENSITIVE Sensitive     ERYTHROMYCIN >=8 RESISTANT Resistant     GENTAMICIN <=0.5 SENSITIVE Sensitive     OXACILLIN <=0.25 SENSITIVE Sensitive     TETRACYCLINE <=1 SENSITIVE Sensitive     VANCOMYCIN 1 SENSITIVE Sensitive     TRIMETH/SULFA <=10 SENSITIVE Sensitive     CLINDAMYCIN <=0.25 SENSITIVE Sensitive     RIFAMPIN <=0.5 SENSITIVE Sensitive     Inducible Clindamycin NEGATIVE Sensitive     * 70,000 COLONIES/mL STAPHYLOCOCCUS AUREUS  SARS CORONAVIRUS 2 (TAT 6-24 HRS) Nasopharyngeal Nasopharyngeal Swab     Status: None   Collection Time: 10/11/19 11:04 AM   Specimen: Nasopharyngeal Swab  Result Value Ref Range Status   SARS Coronavirus 2 NEGATIVE NEGATIVE Final    Comment: (NOTE) SARS-CoV-2 target nucleic acids are NOT DETECTED. The SARS-CoV-2 RNA is generally detectable in upper and lower respiratory specimens during the acute phase of infection. Negative results do not preclude SARS-CoV-2 infection, do not rule out co-infections with other pathogens, and should not be used as the sole basis for treatment or other patient management decisions. Negative results must be combined with clinical observations, patient history, and epidemiological information. The expected result is Negative. Fact Sheet for Patients: SugarRoll.be Fact Sheet for Healthcare Providers: https://www.woods-mathews.com/ This test is not  yet approved or cleared by the [place] FDA and  has been authorized for detection and/or diagnosis of SARS-CoV-2 by FDA under an Emergency Use Authorization (EUA). This EUA will remain  in effect (meaning this test can be used) for the duration of the COVID-19 declaration under Section 56 4(b)(1) of the Act, 21 U.S.C. section 360bbb-3(b)(1), unless the authorization is terminated or revoked sooner. Performed at [facility], [place] [address], [place], [place] [address]      Labs: BNP (last 3 results) Recent Labs    09/20/19 1137  BNP 32.6   Basic Metabolic Panel: Recent Labs  Lab 10/06/19 0618 10/06/19 1842 10/07/19 0503 10/08/19 0546 10/09/19 0604  NA 137 134* 137 137 138  K 4.4 4.2 4.5 4.1 4.2  CL 98  --  102 98 101  CO2 28  --  _0 GLUCOSE 131*  --  114* 123* 116*  BUN 12  --  _1 CREATININE 0.34*  --  <0.30* <0.30* <0.30*  CALCIUM 8.9  --  8.8* 9.1 9.1   Liver Function Tests: No results for input(s): AST, ALT, ALKPHOS, BILITOT, PROT, ALBUMIN in the last 168 hours. No results for input(s): LIPASE, AMYLASE in the last 168 hours. No results for input(s): AMMONIA in the last 168 hours. CBC: Recent Labs  Lab 10/05/19 0617 10/06/19 1842 10/07/19 0503  WBC 6.0  --  6.6  HGB 7.2* 8.2* 8.3*  HCT 23.4* 24.0* 26.2*  MCV 93.2  --  91.6  PLT 246  --  283   Cardiac Enzymes: No results for input(s): CKTOTAL, CKMB, CKMBINDEX, TROPONINI in the last 168 hours. BNP: Invalid input(s): POCBNP CBG: Recent Labs  Lab 10/10/19 2331 10/11/19 0337 10/11/19 0754 10/11/19 1218 10/11/19 1547  GLUCAP 102* 112* 121* 193* 122*   D-Dimer No results for input(s): DDIMER in the last 72 hours. Hgb A1c No results for input(s): HGBA1C in the last 72 hours. Lipid Profile No results for input(s): CHOL, HDL, LDLCALC, TRIG, CHOLHDL, LDLDIRECT in the last 72 hours. Thyroid function studies No results for input(s): TSH, T4TOTAL, T3FREE, THYROIDAB in the last 72  hours.  Invalid input(s): FREET3 Anemia work up No results for input(s): VITAMINB12, FOLATE, FERRITIN, TIBC, IRON, RETICCTPCT in the last 72 hours. Urinalysis    Component Value Date/Time   COLORURINE YELLOW 10/01/2019 2350   APPEARANCEUR CLOUDY (A) 10/01/2019 2350   LABSPEC 1.019 10/01/2019 2350   PHURINE 6.0 10/01/2019 2350   GLUCOSEU NEGATIVE 10/01/2019 2350   HGBUR NEGATIVE 10/01/2019 2350   BILIRUBINUR NEGATIVE 10/01/2019 2350   KETONESUR NEGATIVE 10/01/2019 2350   PROTEINUR 30 (A) 10/01/2019 2350   NITRITE NEGATIVE 10/01/2019 2350   LEUKOCYTESUR NEGATIVE 10/01/2019 2350   Sepsis Labs Invalid input(s): PROCALCITONIN,  WBC,  LACTICIDVEN Microbiology Recent Results (from the past 240 hour(s))  SARS Coronavirus 2 by RT PCR (hospital order, performed in [facility] hospital lab) Nasopharyngeal Nasopharyngeal Swab     Status: None   Collection Time: 10/01/19  6:50 PM   Specimen: Nasopharyngeal Swab  Result Value Ref Range Status   SARS Coronavirus 2 NEGATIVE NEGATIVE Final    Comment: (NOTE) If result is NEGATIVE SARS-CoV-2 target nucleic acids are NOT DETECTED. The SARS-CoV-2 RNA is generally detectable in upper and lower  respiratory specimens during the acute phase of infection. The lowest  concentration of SARS-CoV-2 viral copies this assay can detect is 250  copies / mL. A negative result does not preclude SARS-CoV-2 infection  and should not be used as the sole basis for treatment or other  patient management decisions.  A negative result may occur with  improper specimen collection / handling, submission of specimen other  than nasopharyngeal swab, presence of viral mutation(s) within the  areas targeted by this assay, and inadequate number of viral copies  (<250 copies / mL). A negative result must be combined with clinical  observations, patient history, and epidemiological information. If result is POSITIVE SARS-CoV-2 target nucleic acids are DETECTED. The  SARS-CoV-2 RNA is generally detectable in upper and lower  respiratory specimens dur ing the acute phase of infection.  Positive  results are indicative of active infection with SARS-CoV-2.  Clinical  correlation with patient history and other diagnostic information is  necessary to determine patient infection status.  Positive results do  not rule out bacterial infection or co-infection with other viruses. If result is PRESUMPTIVE POSTIVE SARS-CoV-2 nucleic acids MAY BE PRESENT.   A presumptive positive result was obtained on the submitted specimen  and confirmed on repeat testing.  While 2019 novel coronavirus  (SARS-CoV-2) nucleic acids may be present in the submitted sample  additional confirmatory testing may be necessary for epidemiological  and / or clinical management purposes  to differentiate between  SARS-CoV-2 and other Sarbecovirus currently known to infect humans.  If clinically indicated additional testing with an alternate test  methodology [phone] is advised. The SARS-CoV-2 RNA is generally  detectable in upper and lower respiratory sp ecimens during the acute  phase of infection. The expected result is Negative.  Fact Sheet for Patients:  StrictlyIdeas.no Fact Sheet for Healthcare Providers: BankingDealers.co.za This test is not yet approved or cleared by the [place] FDA and has been authorized for detection and/or diagnosis of SARS-CoV-2 by FDA under an Emergency Use Authorization (EUA).  This EUA will remain in effect (meaning this test can be used) for the duration of the COVID-19 declaration under Section 564(b)(1) of the Act, 21 U.S.C. section 360bbb-3(b)(1), unless the authorization is terminated or revoked sooner. Performed at [facility], [place] [address] [place], [place] [address]   Blood Culture (routine x 2)     Status: None   Collection Time: 10/01/19  6:52 PM   Specimen: BLOOD RIGHT ARM   Result Value Ref Range Status   Specimen Description BLOOD RIGHT ARM  Final   Special Requests   Final    BOTTLES DRAWN AEROBIC AND ANAEROBIC Blood Culture adequate volume   Culture   Final    NO GROWTH 5 DAYS Performed at [facility], [place] [address] [place], [place] [address]    Report Status 10/06/2019 FINAL  Final  Blood Culture (routine x 2)     Status: None   Collection Time: 10/01/19  7:30 PM   Specimen: BLOOD  Result Value Ref Range Status   Specimen Description BLOOD LEFT ANTECUBITAL  Final   Special Requests   Final    BOTTLES DRAWN AEROBIC AND ANAEROBIC Blood Culture adequate volume   Culture   Final    NO GROWTH 5 DAYS Performed at [facility], [place] [address] [place], [place] [address]    Report Status 10/06/2019 FINAL  Final  Urine culture     Status: None   Collection Time: 10/01/19 11:50 PM   Specimen: In/Out Cath Urine  Result Value Ref Range Status   Specimen Description IN/OUT CATH URINE  Final   Special Requests NONE  Final   Culture   Final    NO GROWTH Performed at [facility], [place] [address], [place], [place] [address]    Report Status 10/02/2019 FINAL  Final  Culture, respiratory (tracheal aspirate)     Status: None   Collection Time: 10/02/19  3:20 AM   Specimen: Tracheal Aspirate; Respiratory  Result Value Ref Range Status   Specimen Description TRACHEAL ASPIRATE  Final   Special Requests NONE  Final   Gram Stain   Final    RARE WBC PRESENT,BOTH PMN AND MONONUCLEAR RARE GRAM POSITIVE COCCI IN PAIRS Performed at [facility], [place] [address] [place], [place] [address]    Culture FEW STAPHYLOCOCCUS AUREUS  Final   Report Status 10/05/2019 FINAL  Final   Organism ID, Bacteria STAPHYLOCOCCUS AUREUS  Final      Susceptibility   Staphylococcus aureus - MIC*    CIPROFLOXACIN <=0.5 SENSITIVE Sensitive     ERYTHROMYCIN >=8 RESISTANT Resistant     GENTAMICIN <=0.5 SENSITIVE Sensitive     OXACILLIN <=0.25 SENSITIVE  Sensitive     TETRACYCLINE <=1 SENSITIVE Sensitive     VANCOMYCIN 1 SENSITIVE Sensitive     TRIMETH/SULFA <=10 SENSITIVE Sensitive     CLINDAMYCIN <=0.25 SENSITIVE Sensitive     RIFAMPIN <=0.5 SENSITIVE Sensitive     Inducible Clindamycin NEGATIVE Sensitive     * FEW STAPHYLOCOCCUS AUREUS  MRSA PCR Screening     Status: None   Collection Time: 10/02/19  9:05 PM   Specimen: Nasal Mucosa; Nasopharyngeal  Result Value Ref Range Status  MRSA by PCR NEGATIVE NEGATIVE Final    Comment:        The GeneXpert MRSA Assay (FDA approved for NASAL specimens only), is one component of a comprehensive MRSA colonization surveillance program. It is not intended to diagnose MRSA infection nor to guide or monitor treatment for MRSA infections. Performed at [facility], [place] [address] [place], [place] [address]   Culture, bal-quantitative     Status: Abnormal   Collection Time: 10/03/19 10:48 AM   Specimen: Bronchoalveolar Lavage; Respiratory  Result Value Ref Range Status   Specimen Description BRONCHIAL ALVEOLAR LAVAGE  Final   Special Requests NONE  Final   Gram Stain   Final    FEW WBC PRESENT, PREDOMINANTLY PMN FEW YEAST FEW GRAM POSITIVE COCCI IN PAIRS IN CLUSTERS FEW GRAM POSITIVE RODS Performed at [facility], [place] [address] [place], [place] [address]    Culture (A)  Final    70,000 COLONIES/mL STAPHYLOCOCCUS AUREUS 80,000 COLONIES/mL ESCHERICHIA COLI    Report Status 10/05/2019 FINAL  Final   Organism ID, Bacteria STAPHYLOCOCCUS AUREUS (A)  Final   Organism ID, Bacteria ESCHERICHIA COLI (A)  Final      Susceptibility   Escherichia coli - MIC*    AMPICILLIN 8 SENSITIVE Sensitive     CEFAZOLIN <=4 SENSITIVE Sensitive     CEFEPIME <=1 SENSITIVE Sensitive     CEFTAZIDIME <=1 SENSITIVE Sensitive     CEFTRIAXONE <=1 SENSITIVE Sensitive     CIPROFLOXACIN <=0.25 SENSITIVE Sensitive     GENTAMICIN <=1 SENSITIVE Sensitive     IMIPENEM <=0.25 SENSITIVE Sensitive      TRIMETH/SULFA <=20 SENSITIVE Sensitive     AMPICILLIN/SULBACTAM 4 SENSITIVE Sensitive     PIP/TAZO <=4 SENSITIVE Sensitive     Extended ESBL NEGATIVE Sensitive     * 80,000 COLONIES/mL ESCHERICHIA COLI   Staphylococcus aureus - MIC*    CIPROFLOXACIN <=0.5 SENSITIVE Sensitive     ERYTHROMYCIN >=8 RESISTANT Resistant     GENTAMICIN <=0.5 SENSITIVE Sensitive     OXACILLIN <=0.25 SENSITIVE Sensitive     TETRACYCLINE <=1 SENSITIVE Sensitive     VANCOMYCIN 1 SENSITIVE Sensitive     TRIMETH/SULFA <=10 SENSITIVE Sensitive     CLINDAMYCIN <=0.25 SENSITIVE Sensitive     RIFAMPIN <=0.5 SENSITIVE Sensitive     Inducible Clindamycin NEGATIVE Sensitive     * 70,000 COLONIES/mL STAPHYLOCOCCUS AUREUS  SARS CORONAVIRUS 2 (TAT 6-24 HRS) Nasopharyngeal Nasopharyngeal Swab     Status: None   Collection Time: 10/11/19 11:04 AM   Specimen: Nasopharyngeal Swab  Result Value Ref Range Status   SARS Coronavirus 2 NEGATIVE NEGATIVE Final    Comment: (NOTE) SARS-CoV-2 target nucleic acids are NOT DETECTED. The SARS-CoV-2 RNA is generally detectable in upper and lower respiratory specimens during the acute phase of infection. Negative results do not preclude SARS-CoV-2 infection, do not rule out co-infections with other pathogens, and should not be used as the sole basis for treatment or other patient management decisions. Negative results must be combined with clinical observations, patient history, and epidemiological information. The expected result is Negative. Fact Sheet for Patients: SugarRoll.be Fact Sheet for Healthcare Providers: https://www.woods-mathews.com/ This test is not yet approved or cleared by the [place] FDA and  has been authorized for detection and/or diagnosis of SARS-CoV-2 by FDA under an Emergency Use Authorization (EUA). This EUA will remain  in effect (meaning this test can be used) for the duration of the COVID-19 declaration under  Section 56  4(b)(1) of the Act, 21 U.S.C. section 360bbb-3(b)(1), unless the authorization is terminated or revoked sooner. Performed at [facility], [place] [address], [place], [place] [address]      Time coordinating discharge: Over 30 minutes  SIGNED:   Charolotte Capuchin, MD  [facility] 10/11/2019, 4:44 PM Pager   If 7PM-7AM, please contact night-coverage [email or web address] Password [email or web address]  Addendum to discharge summary.: Patient was seen and examined at bedside there was no new complaints no change since the discharge summary of October 11, 2019.  Patient is ready to be discharged to [facility].

## 2019-10-11 NOTE — Progress Notes (Signed)
Palliative:  Kam[place] is sleeping. Noted plans for discharge today (or when negative COVID test confirmed). Discussed with CSW for outpatient palliative to follow at SNF and spoke with attending about recommending outpatient palliative in d/c summary. Palliative to follow at next venue for ongoing [facility] conversation.   No charge  Vinie Sill, NP Palliative Medicine Team Pager [phone] (Please see [email or web address] for schedule) Team Phone [phone]

## 2019-10-11 NOTE — Progress Notes (Addendum)
CSW received confirmation from Cathlean Marseilles in admissions at [facility] for this patient's admission to the facility.  CSW will wait for room assignment information and will facilitate discharge.  Madilyn Fireman, MSW, LCSW-A Transitions of Care  Clinical Social Worker  [facility] Emergency Departments  Medical ICU [phone]

## 2019-10-12 LAB — GLUCOSE, CAPILLARY
Glucose-Capillary: 102 mg/dL — ABNORMAL HIGH (ref 70–99)
Glucose-Capillary: 93 mg/dL (ref 70–99)
Glucose-Capillary: 94 mg/dL (ref 70–99)

## 2019-10-12 MED ORDER — RILUZOLE 50 MG/10ML PO SUSP
50.0000 mg | Freq: Two times a day (BID) | ORAL | Status: DC
  Administered 2019-10-12: 08:00:00 50 mg
  Filled 2019-10-12: qty 10

## 2019-10-12 MED ORDER — RILUZOLE 50 MG/10ML PO SUSP: 50.0000 mg | Freq: Two times a day (BID) | ORAL | 0 refills | Status: AC

## 2019-10-12 NOTE — Progress Notes (Signed)
[facility] at bedside, report has been called, pt belongings sent with pt.

## 2019-10-12 NOTE — Progress Notes (Signed)
CSW spoke with Ruby at [facility] who states this patient is still on the list for transport. Ruby unable to tell CSW how many other patient's are ahead of this one for pick up.   CSW informed [employer] Network engineer of information. CSW spoke with Network engineer at [facility] to confirm the patient is still coming to the facility today, Network engineer agreeable.  Madilyn Fireman, MSW, LCSW-A Transitions of Care  Clinical Social Worker  [facility] Emergency Departments  Medical ICU [phone]

## 2019-10-13 ENCOUNTER — Ambulatory Visit: Payer: Self-pay | Admitting: Family Medicine

## 2019-11-19 DIAGNOSIS — R0902 Hypoxemia: Secondary | ICD-10-CM

## 2019-11-19 DIAGNOSIS — Z978 Presence of other specified devices: Secondary | ICD-10-CM

## 2021-02-02 DEATH — deceased

## 2021-08-18 IMAGING — CT CT CHEST W/ CM
2 of 3 series · 14 of 36 positions shown, 17 images · IV contrast (APPLIED)
Comparison: Chest radiographs 08/25/2019

CLINICAL DATA: Acute on chronic respiratory failure. Possible
nodule on radiograph.

EXAM:
CT CHEST WITH CONTRAST
TECHNIQUE: Multidetector CT imaging of the chest was performed during
intravenous contrast administration.
CONTRAST:  75mL OMNIPAQUE IOHEXOL 300 MG/ML  SOLN

[Series 3: thorax 2.0 i31f 2 · axial · 0.76mm/px · z∈[-256,-48]mm · 11 of 122 slices shown, 14 images]
[im 9/122  mediastinal]
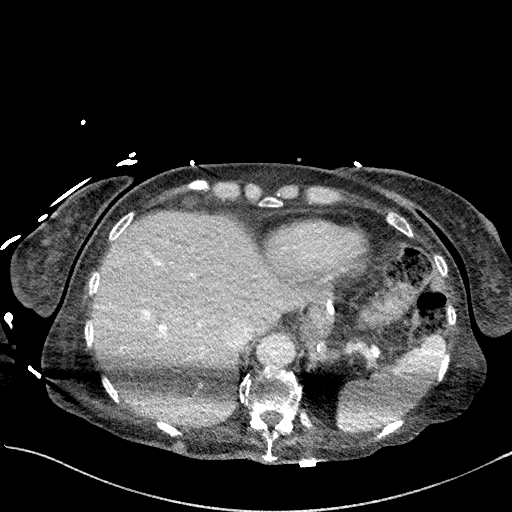
[im 9/122  lung]
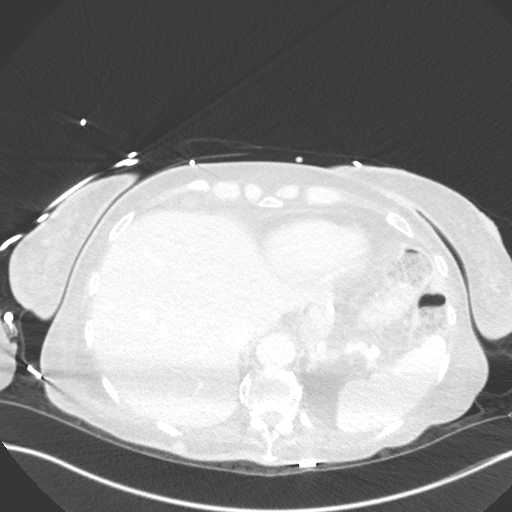
[im 18/122  lung]
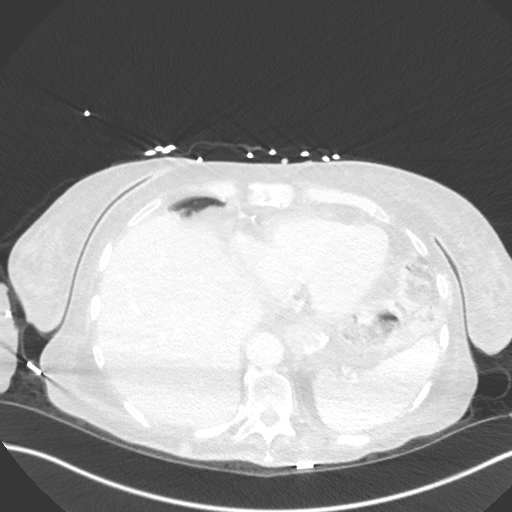
[im 27/122  lung]
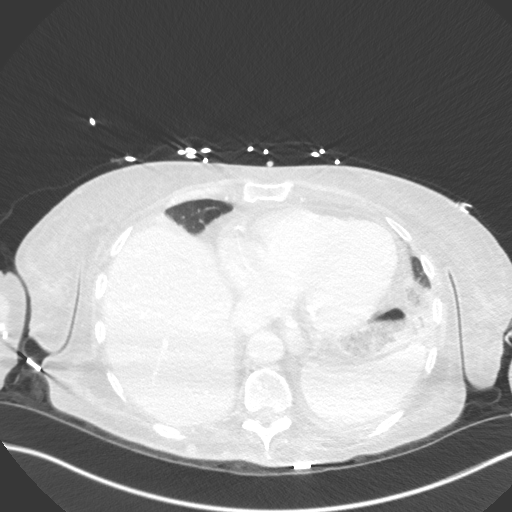
[im 41/122  lung]
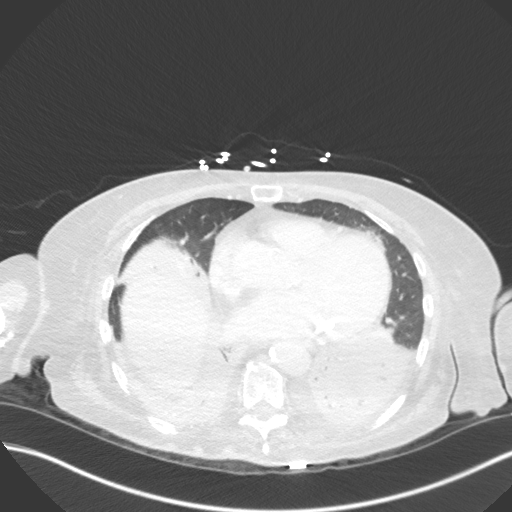
[im 50/122  mediastinal]
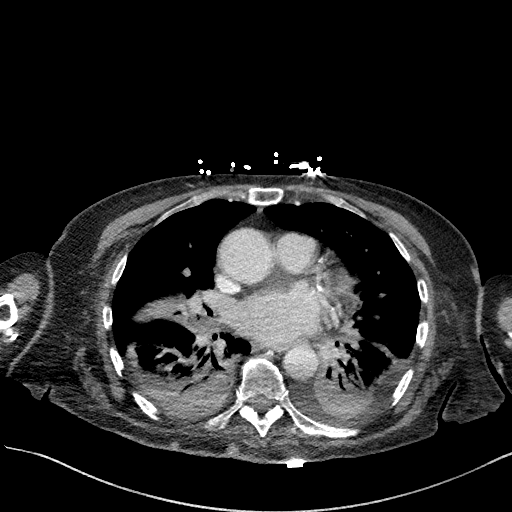
[im 50/122  lung]
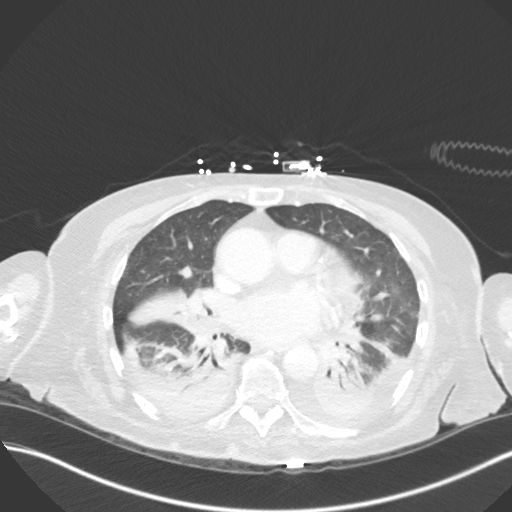
[im 63/122  lung]
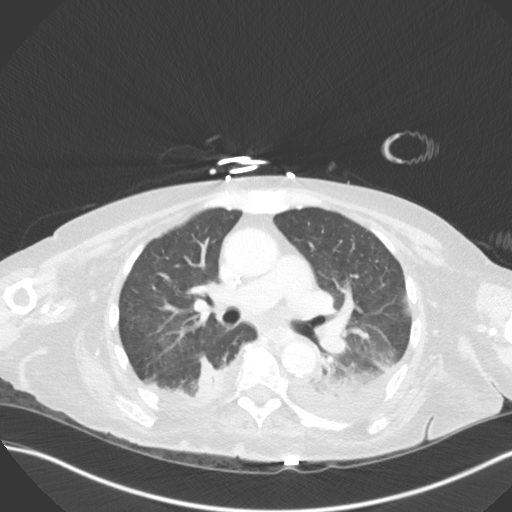
[im 72/122  lung]
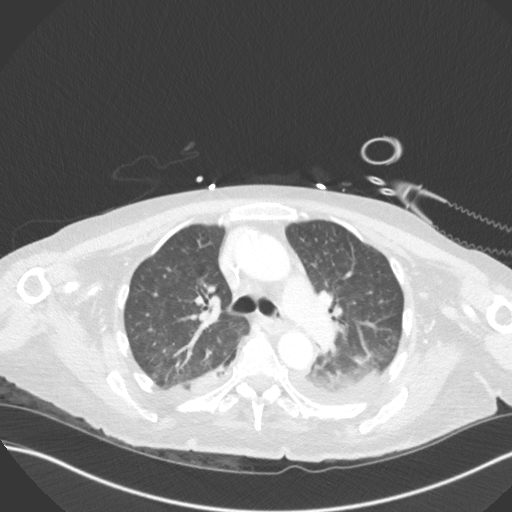
[im 81/122  lung]
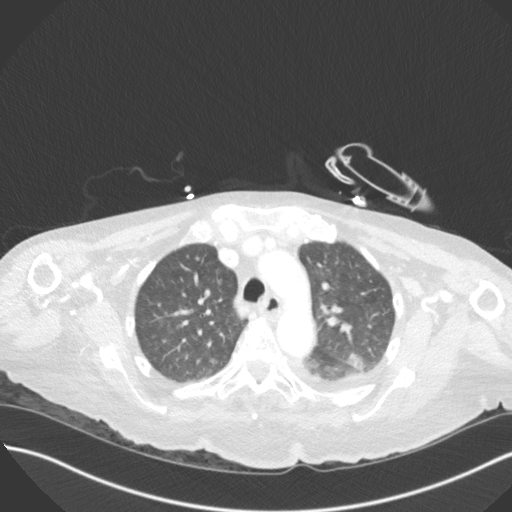
[im 95/122  mediastinal]
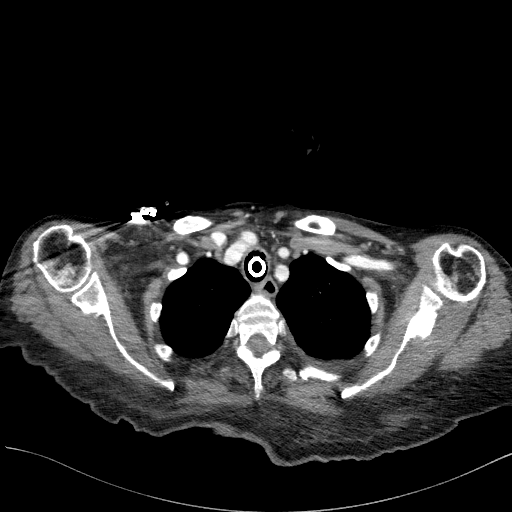
[im 95/122  lung]
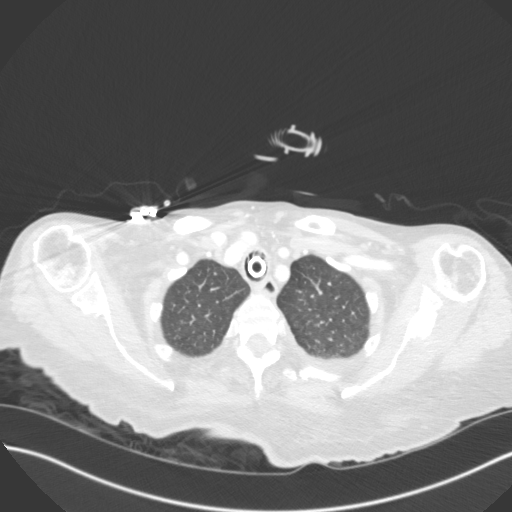
[im 104/122  lung]
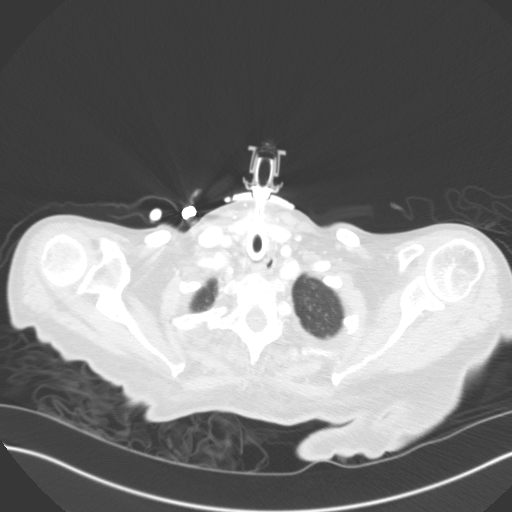
[im 113/122  lung]
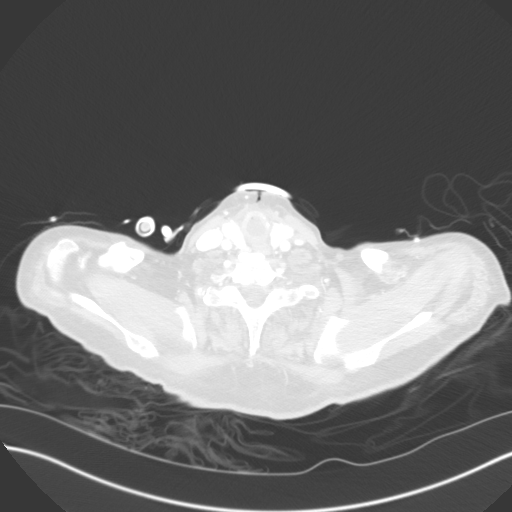

[Series 6: coronal · coronal · 0.48mm/px · 3 of 116 slices shown]
[im 24/116  lung]
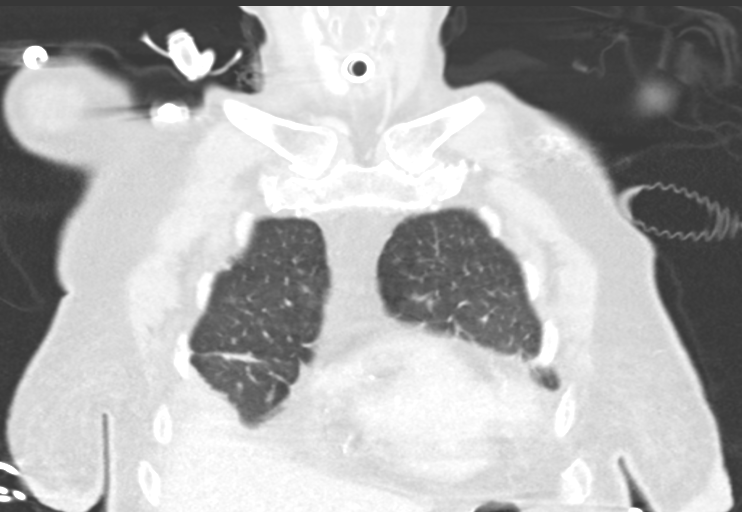
[im 47/116  lung]
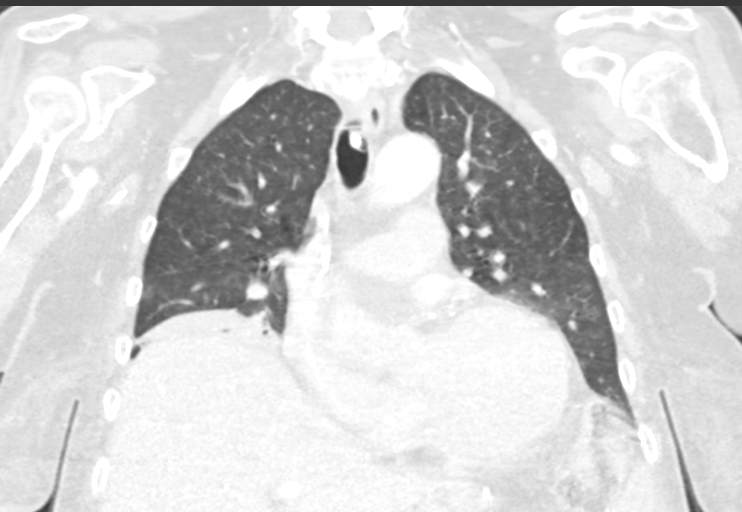
[im 70/116  lung]
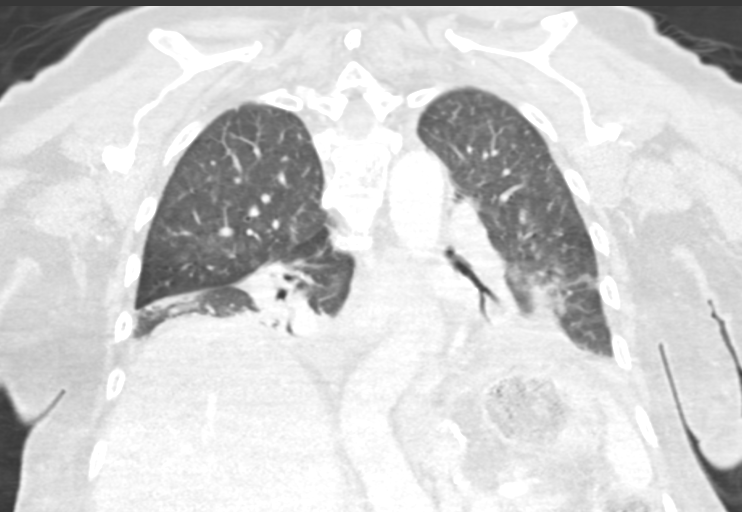

[14 of 36 positions shown; findings below may reference images not displayed]

FINDINGS: Cardiovascular: Left upper extremity PICC in the mid SVC. Mild
cardiomegaly. Mitral annulus and coronary artery calcifications.
Aortic atherosclerosis and tortuosity. No evidence of dissection.
Contrast refluxes into the right greater than left internal jugular
vein. No pericardial effusion.

Mediastinum/Nodes: Tracheostomy tube in place. Fluid/secretions
minimal fluid in the mid esophagus, decompressed distally.
Secretions adjacent to the tracheostomy tube and in the dependent
trachea. Secretions extend into the bilateral mainstem bronchus,
left greater than right. Limited assessment of hilar regions due to
adjacent airspace disease, no bulky mediastinal or hilar adenopathy.
In the trachea above the tracheostomy tube.

Lungs/Pleura: Low lung volumes with significant compressive
atelectasis in both lower lobes and right middle lobe. Patchy
ground-glass opacities in the anterior left upper lobe, some of
which appear nodular and may account for radiographic appearance,
(series 5 image 49). No dominant pulmonary nodule. No evidence of
pulmonary edema. Small bilateral pleural effusions, left greater
than right.

Upper Abdomen: Previous gastric surgery, partially included. No
definite acute finding.

Musculoskeletal: Degenerative change in the spine. There are no
acute or suspicious osseous abnormalities.
IMPRESSION: 1. Low lung volumes with compressive atelectasis both lower lobes
and right middle lobe.
2. Small bilateral pleural effusions, left greater than right.
3. Patchy ground-glass opacities in the anterior left upper lobe,
some of which appear nodular and may account for radiographic
appearance. This may be infectious or inflammatory. No solid
pulmonary nodule. Consider follow-up CT.
4. Tracheostomy tube in place with associated tracheal secretions.
Retained mucus in the dependent trachea and left greater than right
mainstem bronchi.
5. Aortic atherosclerosis.  Coronary artery calcifications.

Aortic Atherosclerosis ([id number]).
